# Patient Record
Sex: Male | Born: 1965 | Race: White | Hispanic: No | Marital: Married | Smoking: Never smoker
Health system: Southern US, Community
[De-identification: ages and names within clinical notes are randomized; demographics above are authoritative.]

## PROBLEM LIST (undated history)

## (undated) DIAGNOSIS — L989 Disorder of the skin and subcutaneous tissue, unspecified: Secondary | ICD-10-CM

## (undated) DIAGNOSIS — I10 Essential (primary) hypertension: Secondary | ICD-10-CM

## (undated) DIAGNOSIS — B351 Tinea unguium: Secondary | ICD-10-CM

## (undated) DIAGNOSIS — E785 Hyperlipidemia, unspecified: Secondary | ICD-10-CM

## (undated) DIAGNOSIS — E663 Overweight: Secondary | ICD-10-CM

## (undated) HISTORY — DX: Disorder of the skin and subcutaneous tissue, unspecified: L98.9

## (undated) HISTORY — DX: Tinea unguium: B35.1

## (undated) HISTORY — DX: Hyperlipidemia, unspecified: E78.5

## (undated) HISTORY — DX: Overweight: E66.3

## (undated) HISTORY — DX: Essential (primary) hypertension: I10

## (undated) HISTORY — PX: WISDOM TOOTH EXTRACTION: SHX21

---

## 2007-10-09 ENCOUNTER — Ambulatory Visit: Payer: Self-pay | Admitting: General Practice

## 2009-03-02 IMAGING — CT CT ABD-PELV W/O CM
1 of 2 series · 15 of 32 positions shown, 19 images · non-contrast
Comparison: none

REASON FOR EXAM: LLQ pain  possible stones   CALL report   [id number]
COMMENTS:

PROCEDURE:     CT  - CT ABDOMEN AND PELVIS W[DATE]  [DATE]
RESULT:     Comparison: No available comparison exam.
TECHNIQUE: CT examination of the abdomen and pelvis was performed without
contrast. Collimation is 3 mm.

[Series 2: stone · axial · 0.84mm/px · z∈[-453,+45]mm · 15 of 182 slices shown, 19 images]
[im 8/182  soft-tissue]
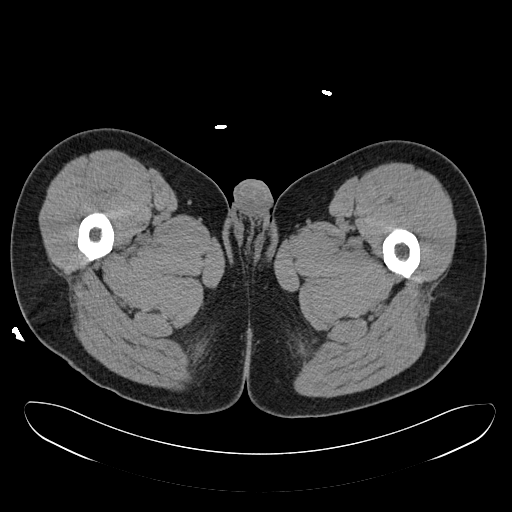
[im 8/182  bone]
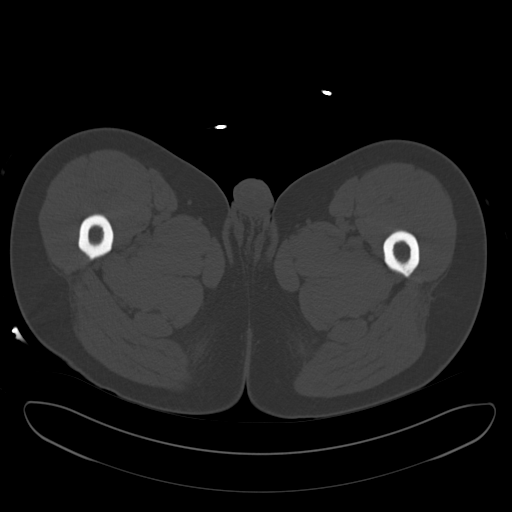
[im 22/182  soft-tissue]
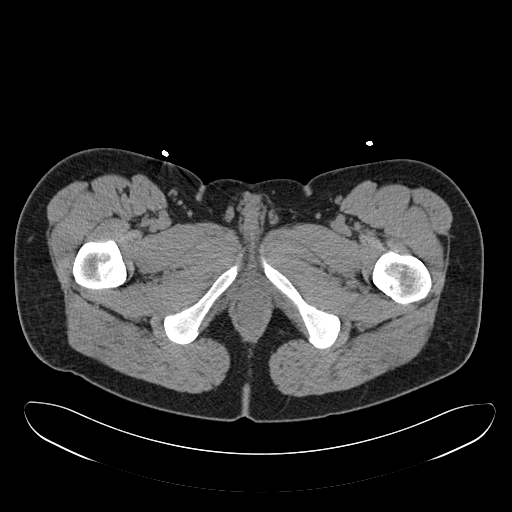
[im 37/182  soft-tissue]
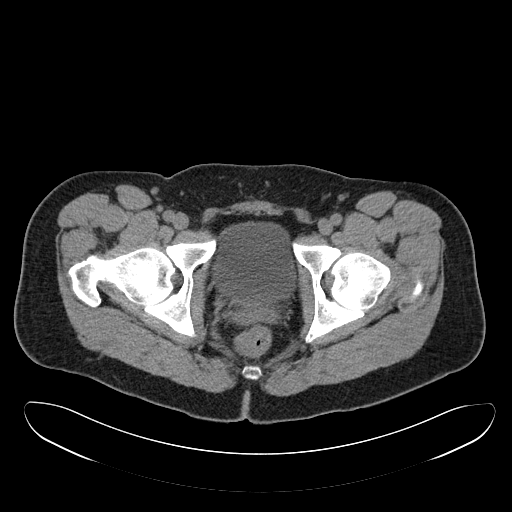
[im 51/182  soft-tissue]
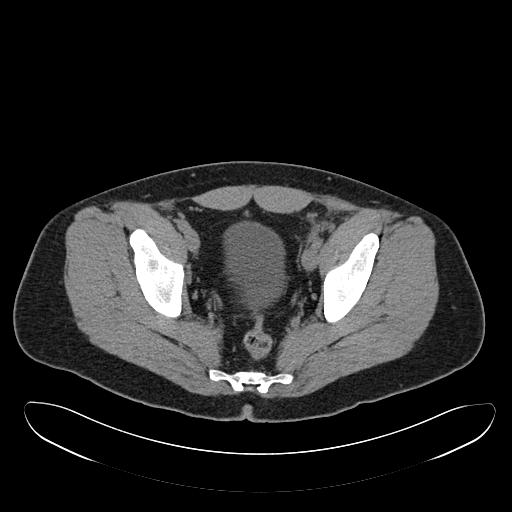
[im 66/182  soft-tissue]
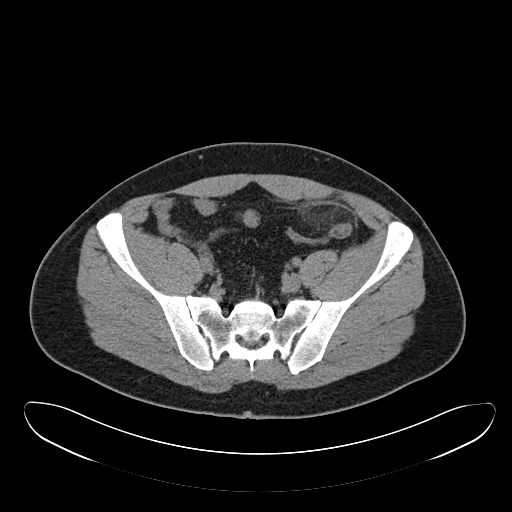
[im 80/182  soft-tissue]
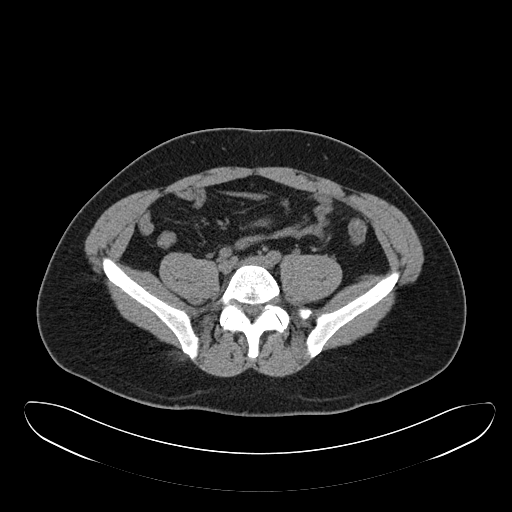
[im 95/182  soft-tissue]
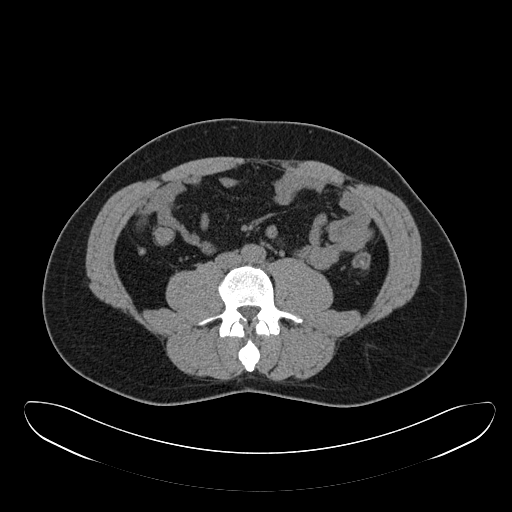
[im 102/182  soft-tissue]
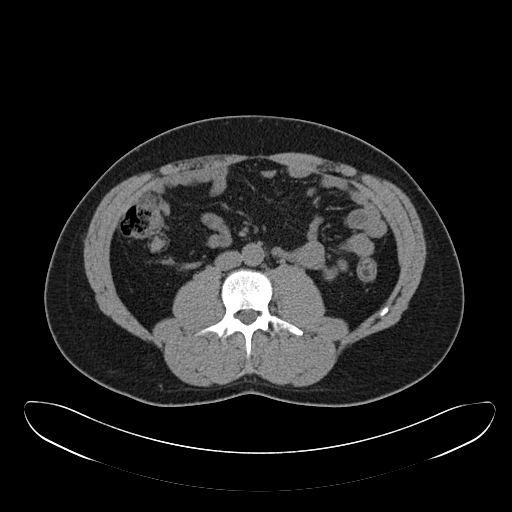
[im 116/182  soft-tissue]
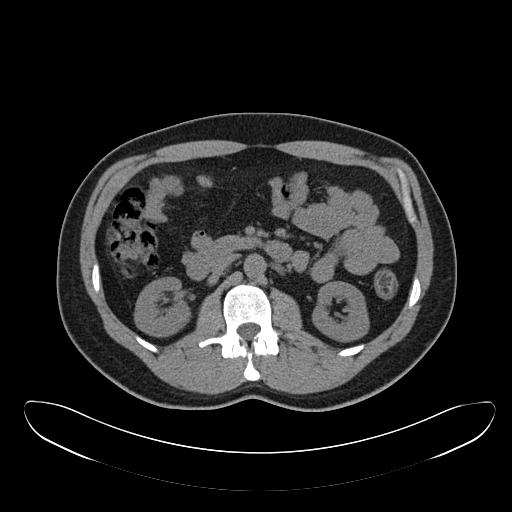
[im 116/182  bone]
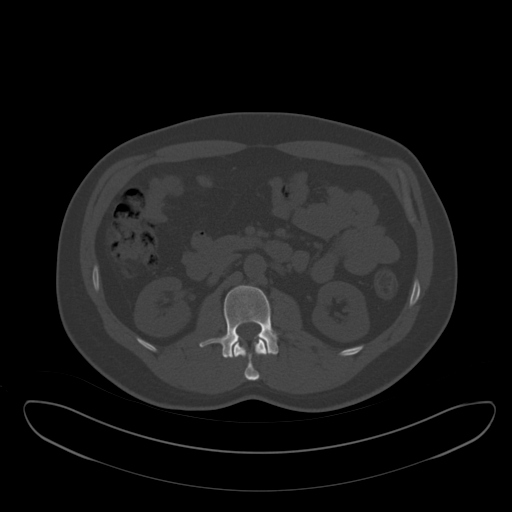
[im 131/182  soft-tissue]
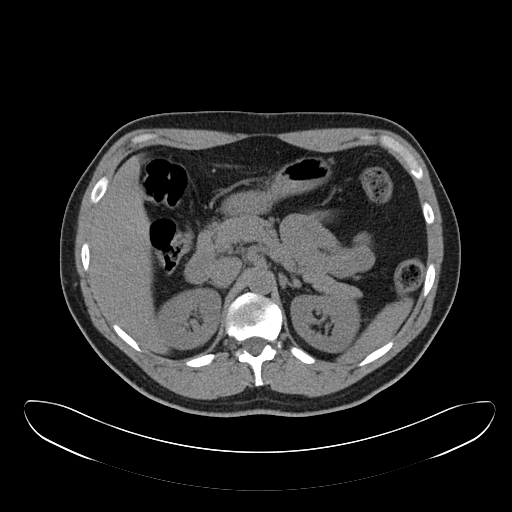
[im 145/182  soft-tissue]
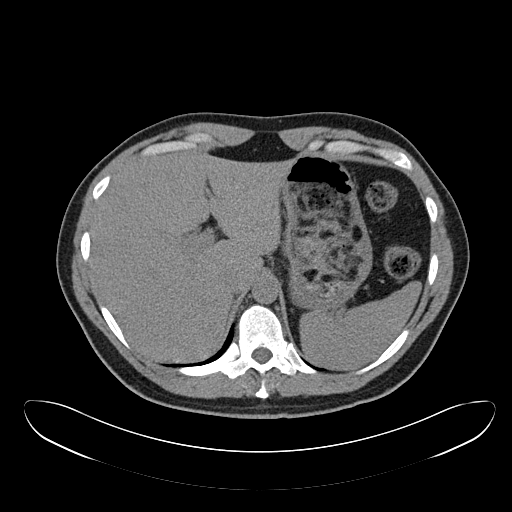
[im 153/182  lung]
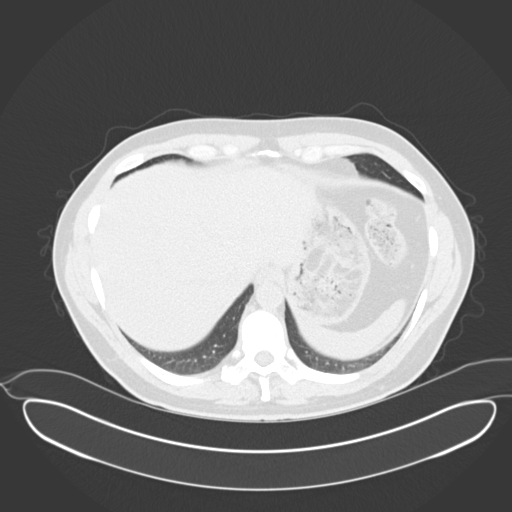
[im 160/182  soft-tissue]
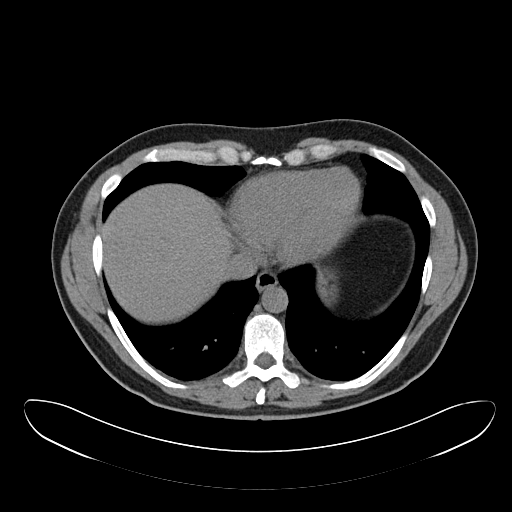
[im 160/182  lung]
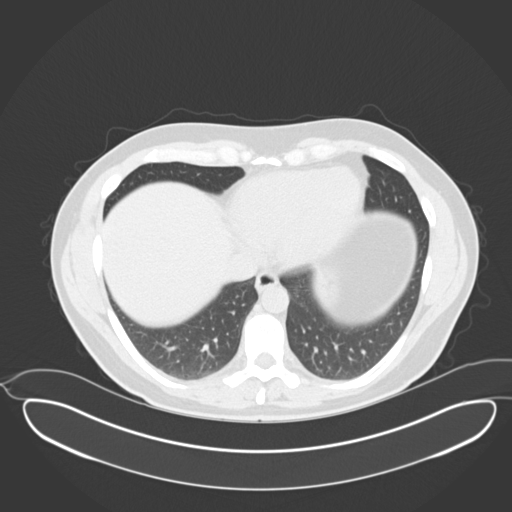
[im 167/182  lung]
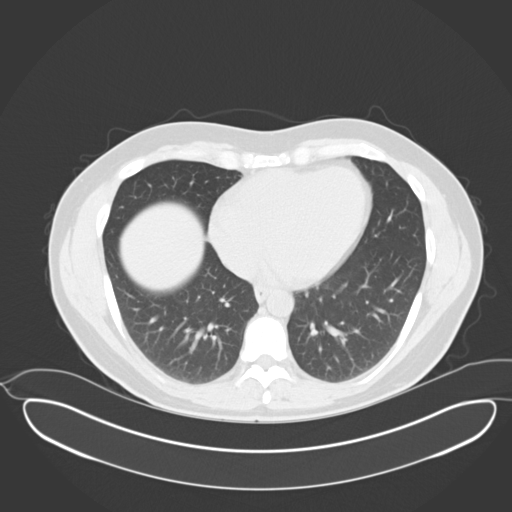
[im 174/182  soft-tissue]
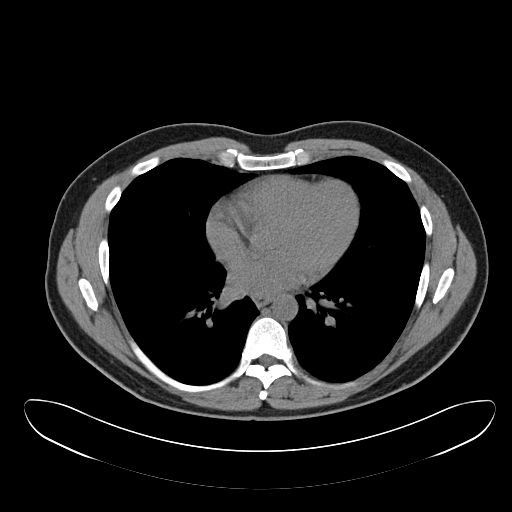
[im 174/182  lung]
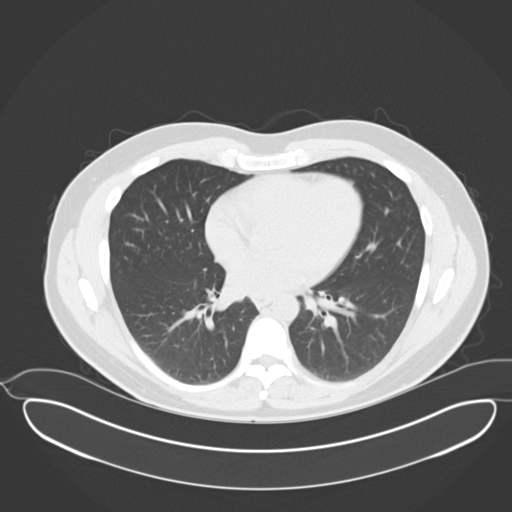

[15 of 32 positions shown; findings below may reference images not displayed]

FINDINGS: Limited evaluation of the lung bases is unremarkable.

Evaluation of the abdominal organs, bowels, and vessels is limited without
contrast. The liver, gallbladder, spleen, pancreas, and adrenal glands are
grossly unremarkable. No renal or ureteral stone is noted. There is no
hydronephrosis.

There is no dilatation of the bowels. The appendix is unremarkable. There is
focal area of intra-abdominal fat stranding involving the anterior lower
left abdominal. The adjacent sigmoid colon is decompressed and is not
grossly abnormal. There is no intraperitoneal free air. There is no
significant free fluid. There are no enlarged abdominal pelvic lymph nodes.
IMPRESSION: 1. No renal or ureteral stone.
2. Evaluation of abdominal organs, bowels, and vessels is otherwise limited
without contrast.
3. There is focal area of intra abdominal fat stranding involving the
anterior lower left abdominal. The adjacent sigmoid colon is decompressed
and is not grossly abnormal. This is nonspecific, but can be due to focal
fat necrosis.

Findings were discussed with Dr. Aranda at [DATE] on 10/09/07.

## 2016-05-19 ENCOUNTER — Other Ambulatory Visit: Payer: Self-pay | Admitting: Family Medicine

## 2016-05-20 ENCOUNTER — Other Ambulatory Visit: Payer: Self-pay | Admitting: Family Medicine

## 2016-05-20 DIAGNOSIS — R079 Chest pain, unspecified: Secondary | ICD-10-CM

## 2016-05-24 ENCOUNTER — Ambulatory Visit
Admission: RE | Admit: 2016-05-24 | Discharge: 2016-05-24 | Disposition: A | Discharge status: Home / Self Care | Payer: BLUE CROSS/BLUE SHIELD | Source: Ambulatory Visit | Attending: Family Medicine | Admitting: Family Medicine

## 2016-05-24 DIAGNOSIS — R079 Chest pain, unspecified: Secondary | ICD-10-CM

## 2016-05-24 LAB — EXERCISE TOLERANCE TEST
CSEPEDS: 0 s
CSEPPHR: 169 {beats}/min
Estimated workload: 11.7 METS
Exercise duration (min): 10 min
Rest HR: 81 {beats}/min

## 2017-08-11 HISTORY — PX: COLONOSCOPY: SHX174

## 2019-03-15 ENCOUNTER — Other Ambulatory Visit: Payer: Self-pay | Admitting: Family Medicine

## 2019-04-11 ENCOUNTER — Ambulatory Visit: Payer: Self-pay

## 2019-04-11 DIAGNOSIS — Z23 Encounter for immunization: Secondary | ICD-10-CM

## 2019-09-19 ENCOUNTER — Other Ambulatory Visit: Payer: Self-pay

## 2019-09-19 ENCOUNTER — Ambulatory Visit: Payer: 59

## 2019-09-19 DIAGNOSIS — Z0000 Encounter for general adult medical examination without abnormal findings: Secondary | ICD-10-CM

## 2019-09-19 LAB — POCT URINALYSIS DIPSTICK
Bilirubin, UA: NEGATIVE
Blood, UA: NEGATIVE
Glucose, UA: NEGATIVE
Ketones, UA: NEGATIVE
Leukocytes, UA: NEGATIVE
Nitrite, UA: NEGATIVE
Protein, UA: NEGATIVE
Spec Grav, UA: >=1.03 — AB (ref 1.010–1.025)
Urobilinogen, UA: 0.2 E.U./dL
pH, UA: 5 (ref 5.0–8.0)

## 2019-09-19 NOTE — Progress Notes (Signed)
Patient comes in today for pre physical labs and EKG. Patient is scheduled with Durward Parcel, PA-C on 09/26/2019.

## 2019-09-20 LAB — CMP12+LP+TP+TSH+6AC+PSA+CBC?
AST: 18 IU/L (ref 0–40)
Albumin/Globulin Ratio: 2.2 (ref 1.2–2.2)
BUN/Creatinine Ratio: 14 (ref 9–20)
Basos: 1 %
Bilirubin Total: 0.2 mg/dL (ref 0.0–1.2)
Calcium: 9.5 mg/dL (ref 8.7–10.2)
Chol/HDL Ratio: 4.5 ratio (ref 0.0–5.0)
Cholesterol, Total: 211 mg/dL — ABNORMAL HIGH (ref 100–199)
EOS (ABSOLUTE): 0.3 10*3/uL (ref 0.0–0.4)
Estimated CHD Risk: 0.9 times avg. (ref 0.0–1.0)
GFR calc non Af Amer: 69 mL/min/{1.73_m2} (ref >59)
Hemoglobin: 15.1 g/dL (ref 13.0–17.7)
Immature Granulocytes: 0 %
Lymphocytes Absolute: 1.3 10*3/uL (ref 0.7–3.1)
MCHC: 33.6 g/dL (ref 31.5–35.7)
MCV: 91 fL (ref 79–97)
Neutrophils Absolute: 2.6 10*3/uL (ref 1.4–7.0)
Neutrophils: 54 %
Phosphorus: 3.8 mg/dL (ref 2.8–4.1)
Platelets: 248 10*3/uL (ref 150–450)
Potassium: 5 mmol/L (ref 3.5–5.2)
TSH: 1.37 u[IU]/mL (ref 0.450–4.500)
Total Protein: 6.4 g/dL (ref 6.0–8.5)
Uric Acid: 5.7 mg/dL (ref 3.8–8.4)
VLDL Cholesterol Cal: 20 mg/dL (ref 5–40)

## 2019-09-20 LAB — CMP12+LP+TP+TSH+6AC+PSA+CBC…
ALT: 19 IU/L (ref 0–44)
Albumin: 4.4 g/dL (ref 3.8–4.9)
Alkaline Phosphatase: 69 IU/L (ref 39–117)
BUN: 17 mg/dL (ref 6–24)
Basophils Absolute: 0 10*3/uL (ref 0.0–0.2)
Chloride: 104 mmol/L (ref 96–106)
Creatinine, Ser: 1.2 mg/dL (ref 0.76–1.27)
Eos: 7 %
Free Thyroxine Index: 1.4 (ref 1.2–4.9)
GFR calc Af Amer: 79 mL/min/{1.73_m2} (ref >59)
GGT: 12 IU/L (ref 0–65)
Globulin, Total: 2 g/dL (ref 1.5–4.5)
Glucose: 107 mg/dL — ABNORMAL HIGH (ref 65–99)
HDL: 47 mg/dL (ref >39)
Hematocrit: 44.9 % (ref 37.5–51.0)
Immature Grans (Abs): 0 10*3/uL (ref 0.0–0.1)
Iron: 84 ug/dL (ref 38–169)
LDH: 117 IU/L — ABNORMAL LOW (ref 121–224)
LDL Chol Calc (NIH): 144 mg/dL — ABNORMAL HIGH (ref 0–99)
Lymphs: 27 %
MCH: 30.6 pg (ref 26.6–33.0)
Monocytes Absolute: 0.5 10*3/uL (ref 0.1–0.9)
Monocytes: 11 %
Prostate Specific Ag, Serum: 0.7 ng/mL (ref 0.0–4.0)
RBC: 4.94 x10E6/uL (ref 4.14–5.80)
RDW: 12.7 % (ref 11.6–15.4)
Sodium: 140 mmol/L (ref 134–144)
T3 Uptake Ratio: 28 % (ref 24–39)
T4, Total: 5 ug/dL (ref 4.5–12.0)
Triglycerides: 111 mg/dL (ref 0–149)
WBC: 4.8 10*3/uL (ref 3.4–10.8)

## 2019-09-26 ENCOUNTER — Encounter: Payer: Self-pay | Admitting: Physician Assistant

## 2019-09-26 ENCOUNTER — Ambulatory Visit: Payer: Self-pay | Admitting: Physician Assistant

## 2019-09-26 ENCOUNTER — Other Ambulatory Visit: Payer: Self-pay

## 2019-09-26 VITALS — BP 120/80 | HR 82 | Temp 97.9°F | Resp 12 | Ht 70.0 in | Wt 192.0 lb

## 2019-09-26 DIAGNOSIS — Z0000 Encounter for general adult medical examination without abnormal findings: Secondary | ICD-10-CM

## 2019-09-26 DIAGNOSIS — I10 Essential (primary) hypertension: Secondary | ICD-10-CM | POA: Insufficient documentation

## 2019-09-26 NOTE — Progress Notes (Signed)
   Subjective: Annual physical    Patient ID: Brett Hunter, male    DOB: 1965-08-12, 54 y.o.   MRN: [id number]  HPI Patient presents with annual physical.  No concerns or complaints. Review of Systems Skin lesions    Objective:   Physical Exam No acute distress. HEENT grossly unremarkable. Neck is supple for adenopathy or bruits. Lungs are clear to auscultation.  Heart regular rate and rhythm. Abdomen negative HSM, normoactive bowel sounds, soft nontender palpation. No obvious deformity to the upper or lower extremities.  Upper and lower extremities have full equal range of motion. No obvious cervical or lumbar spine deformity.  Patient is full equal range of motion of the cervical lumbar spine. Cranial nerves II through XII grossly intact.  .       Assessment & Plan: Well exam  Discussed lab results showing elevation of cholesterol.  Discussed lifestyle modification versus medication.  Patient elects diet and exercise for follow-up in 6 months.

## 2019-12-04 ENCOUNTER — Other Ambulatory Visit: Payer: Self-pay

## 2019-12-04 DIAGNOSIS — I10 Essential (primary) hypertension: Secondary | ICD-10-CM

## 2019-12-04 MED ORDER — LISINOPRIL 20 MG PO TABS: 20.0000 mg | ORAL_TABLET | Freq: Every day | ORAL | 2 refills | Status: DC

## 2020-05-27 ENCOUNTER — Ambulatory Visit: Payer: Self-pay

## 2020-05-27 DIAGNOSIS — Z23 Encounter for immunization: Secondary | ICD-10-CM

## 2020-06-25 ENCOUNTER — Other Ambulatory Visit: Payer: Self-pay

## 2020-06-25 DIAGNOSIS — Z1152 Encounter for screening for COVID-19: Secondary | ICD-10-CM

## 2020-06-25 NOTE — Progress Notes (Signed)
Presents to clinic requesting to see PA - no provider in the clinic today.  States daughter had strep throat recently.  C/O sore throat & congestion.  States I get a cold every year like this.  Explained to him protocol for covid screening.  Advised we will have the provider in the clinic tomorrow will contact him for a virtual visit.  AMD

## 2020-06-26 ENCOUNTER — Encounter: Payer: Self-pay | Admitting: Physician Assistant

## 2020-06-26 ENCOUNTER — Ambulatory Visit: Payer: Self-pay | Admitting: Physician Assistant

## 2020-06-26 VITALS — BP 131/88 | HR 86 | Temp 98.6°F | Resp 14 | Ht 70.0 in | Wt 193.0 lb

## 2020-06-26 DIAGNOSIS — J020 Streptococcal pharyngitis: Secondary | ICD-10-CM

## 2020-06-26 LAB — POCT RAPID STREP A (OFFICE): Rapid Strep A Screen: POSITIVE — AB

## 2020-06-26 LAB — NOVEL CORONAVIRUS, NAA: SARS-CoV-2, NAA: NOT DETECTED

## 2020-06-26 LAB — SARS-COV-2, NAA 2 DAY TAT

## 2020-06-26 MED ORDER — LIDOCAINE VISCOUS HCL 2 % MT SOLN: 5.0000 mL | Freq: Four times a day (QID) | OROMUCOSAL | 0 refills | Status: DC | PRN

## 2020-06-26 MED ORDER — AMOXICILLIN 875 MG PO TABS: 875.0000 mg | ORAL_TABLET | Freq: Two times a day (BID) | ORAL | 0 refills | Status: DC

## 2020-06-26 MED ORDER — PSEUDOEPH-BROMPHEN-DM 30-2-10 MG/5ML PO SYRP: 5.0000 mL | ORAL_SOLUTION | Freq: Four times a day (QID) | ORAL | 0 refills | Status: DC | PRN

## 2020-06-26 NOTE — Progress Notes (Signed)
° °  Subjective: Sore throat    Patient ID: Brett Hunter, male    DOB: Jul 03, 1966, 54 y.o.   MRN: [id number]  HPI Patient complain of sore throat for 1 week.  Patient exposed to strep by his grand daughter last week.  Patient with mild discomfort with swallowing.  Able to tolerate food and fluids.  Denies recent contact with known contact with COVID-19.  Patient also complain of intermitting productive/nonproductive cough for the same period of time.  Patient has taken the vaccine for COVID-19 also taken the flu shot.   Review of Systems Hypertension    Objective:   Physical Exam No acute distress.  HEENT small for erythematous findings well exudative tonsils.  Bilateral cervical lymphadenopathy.  Positive rapid strep test.  Lungs are clear to auscultation.  Heart regular rate and rhythm.       Assessment & Plan: Strep pharyngitis  Patient given discharge care instructions.  Patient advised take medication as directed consistent amoxicillin, viscous lidocaine, and Bromfed-DM.  Follow-up if no improvement 3 to 5 days.

## 2020-06-26 NOTE — Progress Notes (Signed)
Pt presents today with sore throat, glads are sore and body aches. Pt states he's a little congested. Symptoms started last Wednesday. CL,RMA

## 2020-07-28 DIAGNOSIS — Z01818 Encounter for other preprocedural examination: Secondary | ICD-10-CM

## 2020-08-13 ENCOUNTER — Other Ambulatory Visit: Payer: Self-pay

## 2020-08-13 ENCOUNTER — Ambulatory Visit: Payer: Self-pay

## 2020-08-13 DIAGNOSIS — Z0000 Encounter for general adult medical examination without abnormal findings: Secondary | ICD-10-CM

## 2020-08-13 LAB — POCT URINALYSIS DIPSTICK
Bilirubin, UA: NEGATIVE
Blood, UA: NEGATIVE
Glucose, UA: NEGATIVE
Ketones, UA: NEGATIVE
Leukocytes, UA: NEGATIVE
Nitrite, UA: NEGATIVE
Protein, UA: NEGATIVE
Spec Grav, UA: >=1.03 — AB (ref 1.010–1.025)
Urobilinogen, UA: 0.2 E.U./dL
pH, UA: 6 (ref 5.0–8.0)

## 2020-08-13 NOTE — Progress Notes (Signed)
Scheduled to complete physical 08/20/2020 with Dr. William Holder.  AMD 

## 2020-08-14 LAB — CMP12+LP+TP+TSH+6AC+PSA+CBC…
ALT: 19 IU/L (ref 0–44)
AST: 19 IU/L (ref 0–40)
Albumin/Globulin Ratio: 2.1 (ref 1.2–2.2)
Albumin: 4.4 g/dL (ref 3.8–4.9)
Alkaline Phosphatase: 64 IU/L (ref 44–121)
BUN/Creatinine Ratio: 12 (ref 9–20)
BUN: 15 mg/dL (ref 6–24)
Basophils Absolute: 0 10*3/uL (ref 0.0–0.2)
Basos: 1 %
Bilirubin Total: 0.3 mg/dL (ref 0.0–1.2)
Calcium: 9.6 mg/dL (ref 8.7–10.2)
Chloride: 103 mmol/L (ref 96–106)
Chol/HDL Ratio: 4.6 ratio (ref 0.0–5.0)
Cholesterol, Total: 208 mg/dL — ABNORMAL HIGH (ref 100–199)
Creatinine, Ser: 1.24 mg/dL (ref 0.76–1.27)
EOS (ABSOLUTE): 0.2 10*3/uL (ref 0.0–0.4)
Eos: 4 %
Estimated CHD Risk: 0.9 times avg. (ref 0.0–1.0)
Free Thyroxine Index: 1.6 (ref 1.2–4.9)
GFR calc Af Amer: 76 mL/min/{1.73_m2} (ref >59)
GFR calc non Af Amer: 65 mL/min/{1.73_m2} (ref >59)
GGT: 10 IU/L (ref 0–65)
Globulin, Total: 2.1 g/dL (ref 1.5–4.5)
Glucose: 102 mg/dL — ABNORMAL HIGH (ref 65–99)
HDL: 45 mg/dL (ref >39)
Hematocrit: 44.7 % (ref 37.5–51.0)
Hemoglobin: 15.4 g/dL (ref 13.0–17.7)
Immature Grans (Abs): 0 10*3/uL (ref 0.0–0.1)
Immature Granulocytes: 0 %
Iron: 74 ug/dL (ref 38–169)
LDH: 124 IU/L (ref 121–224)
LDL Chol Calc (NIH): 148 mg/dL — ABNORMAL HIGH (ref 0–99)
Lymphocytes Absolute: 1.5 10*3/uL (ref 0.7–3.1)
Lymphs: 31 %
MCH: 30.9 pg (ref 26.6–33.0)
MCHC: 34.5 g/dL (ref 31.5–35.7)
MCV: 90 fL (ref 79–97)
Monocytes Absolute: 0.5 10*3/uL (ref 0.1–0.9)
Monocytes: 11 %
Neutrophils Absolute: 2.6 10*3/uL (ref 1.4–7.0)
Neutrophils: 53 %
Phosphorus: 3.1 mg/dL (ref 2.8–4.1)
Platelets: 259 10*3/uL (ref 150–450)
Potassium: 5 mmol/L (ref 3.5–5.2)
Prostate Specific Ag, Serum: 0.6 ng/mL (ref 0.0–4.0)
RBC: 4.98 x10E6/uL (ref 4.14–5.80)
RDW: 12.4 % (ref 11.6–15.4)
Sodium: 139 mmol/L (ref 134–144)
T3 Uptake Ratio: 27 % (ref 24–39)
T4, Total: 5.9 ug/dL (ref 4.5–12.0)
TSH: 1.16 u[IU]/mL (ref 0.450–4.500)
Total Protein: 6.5 g/dL (ref 6.0–8.5)
Triglycerides: 85 mg/dL (ref 0–149)
Uric Acid: 5.9 mg/dL (ref 3.8–8.4)
VLDL Cholesterol Cal: 15 mg/dL (ref 5–40)
WBC: 4.9 10*3/uL (ref 3.4–10.8)

## 2020-08-20 ENCOUNTER — Ambulatory Visit: Payer: Self-pay | Admitting: Adult Medicine

## 2020-08-20 ENCOUNTER — Encounter: Payer: Self-pay | Admitting: Adult Medicine

## 2020-08-20 ENCOUNTER — Other Ambulatory Visit: Payer: Self-pay

## 2020-08-20 VITALS — BP 110/77 | HR 80 | Temp 98.6°F | Resp 12 | Ht 70.0 in | Wt 183.0 lb

## 2020-08-20 DIAGNOSIS — I10 Essential (primary) hypertension: Secondary | ICD-10-CM

## 2020-08-20 DIAGNOSIS — Z0000 Encounter for general adult medical examination without abnormal findings: Secondary | ICD-10-CM

## 2020-08-20 MED ORDER — METOLAZONE 5 MG PO TABS: 5.0000 mg | ORAL_TABLET | Freq: Every day | ORAL | 2 refills | Status: DC

## 2020-08-20 MED ORDER — LISINOPRIL 20 MG PO TABS: 20.0000 mg | ORAL_TABLET | Freq: Every day | ORAL | 2 refills | Status: DC

## 2020-08-20 MED ORDER — POTASSIUM CHLORIDE CRYS ER 20 MEQ PO TBCR
20.0000 meq | EXTENDED_RELEASE_TABLET | Freq: Every evening | ORAL | 3 refills | Status: DC
Start: 2020-08-20 — End: 2023-07-27

## 2020-08-20 NOTE — Progress Notes (Signed)
HISTORY  Chief Complaint No chief complaint on file.   HPI Brett Hunter is a 55 y.o. male    Annual Exam denies health concern or complaint Meds report he only takes Zestril 10mg  since last physical Past Medical History:  Diagnosis Date  . Elevated lipids   . Hypertension   . Overweight   . Skin lesion   . Tinea unguium   . Toenail fungus    Patient Active Problem List   Diagnosis Date Noted  . Hypertension 09/26/2019   Past Surgical History:  Procedure Laterality Date  . COLONOSCOPY  08/2017  . WISDOM TOOTH EXTRACTION     Prior to Admission medications   Medication Sig Start Date End Date Taking? Authorizing Provider  amoxicillin (AMOXIL) 875 MG tablet Take 1 tablet (875 mg total) by mouth 2 (two) times daily. 06/26/20   06/28/20, PA-C  brompheniramine-pseudoephedrine-DM 30-2-10 MG/5ML syrup Take 5 mLs by mouth 4 (four) times daily as needed. Mix with 5 mL of viscous lidocaine for swish and swallow 06/26/20   06/28/20, PA-C  Cyanocobalamin (VITAMIN B-12 PO) Take 1 tablet by mouth daily.    [provider]  lidocaine (XYLOCAINE) 2 % solution Use as directed 5 mLs in the mouth or throat every 6 (six) hours as needed for mouth pain. Mix with 5 mL of Bromfed-DM for swish and swallow 06/26/20   06/28/20, PA-C  lisinopril (ZESTRIL) 20 MG tablet Take 1 tablet (20 mg total) by mouth daily. 12/04/19   12/06/19, MD  Multiple Vitamin (MULTIVITAMIN ADULT PO) Take 1 tablet by mouth daily.    [provider]  Omega-3 Fatty Acids (FISH OIL PO) Take 1 capsule by mouth daily.    [provider]    Allergies Patient has no known allergies.  Family History  Problem Relation Age of Onset  . Diabetes Father     Social History Social History   Tobacco Use  . Smoking status: Never Smoker  . Smokeless tobacco: Current User    Types: Snuff    Review of Systems Constitutional: No fever/chills Eyes: No visual changes. ENT: No  sore throat. Cardiovascular: Denies chest pain. Respiratory: Denies shortness of breath. Gastrointestinal: No abdominal pain.  No nausea, no vomiting.  No diarrhea.  No constipation. Genitourinary: Negative for dysuria. Musculoskeletal: Negative for back pain. Skin: Negative for rash. Neurological: Negative for headaches, focal weakness or numbness. Psychiatric: stable mood ___________________________________  PHYSICAL EXAM:  VITAL SIGNS: 110/70 O2 sat98 Constitutional: Alert and oriented. Well appearing and in no acute distress. Eyes: Conjunctivae are normal. PERRL. EOMI. Red facies Head: Atraumatic. Nose: No congestion/rhinnorhea. Mouth/Throat: Mucous membranes are moist.  Oropharynx non-erythematous. Neck: No stridor. No cervical spine tenderness to palpation Hematological/Lymphatic/Immunilogical: No cervical lymphadenopathy. Cardiovascular: Normal rate, regular rhythm. Grossly normal heart sounds.  Good peripheral circulation. Respiratory: Normal respiratory effort.  No retractions. Lungs CTAB. Gastrointestinal: Soft and nontender. No distention. No abdominal bruits. No CVA tenderness. Genitourinary: 2 nl size scrotal testes, prostrate3x3" no nodularity nontender, guiac neg Musculoskeletal: No lower extremity tenderness nor edema.  No joint effusions. Neurologic:  Normal speech and language. No gross focal neurologic deficits are appreciated. No gait instability. Skin:  Skin is warm, dry and intact. Lt forearm  Rt calf 39m excematoid rash noted. No visible nevi Psychiatric: Mood and affect are normal. Speech and behavior are normal. _________________________________   LABS Glucose 102High mg/dL Chol/HDL Ratio 4.6 ratio   Uric Acid 5.9 mg/dL  Estimated CHD Risk  0.9 times avg.   BUN 15 mg/dL TSH 4.765 uIU/mL  Creatinine, Ser 1.24 mg/dL T4, Total 5.9 ug/dL  GFR calc non Af Amer 65 mL/min/1.73 T3 Uptake Ratio 27 %  GFR calc Af Amer 76 mL/min/1.73  Free Thyroxine Index 1.6   BUN/Creatinine Ratio 12 Prostate Specific Ag, Serum 0.6 ng/mL   Sodium 139 mmol/L WBC 4.9 x10E3/uL  Potassium 5.0 mmol/L RBC 4.98 x10E6/uL  Chloride 103 mmol/L Hemoglobin 15.4 g/dL  Calcium 9.6 mg/dL Hematocrit 46.5 %  Phosphorus 3.1 mg/dL MCV 90 fL  Total Protein 6.5 g/dL MCH 03.5 pg  Albumin 4.4 g/dL MCHC 46.5 g/dL  Globulin, Total 2.1 g/dL RDW 68.1 %  Albumin/Globulin Ratio 2.1 Platelets 259 x10E3/uL  Bilirubin Total 0.3 mg/dL Neutrophils 53 %  Alkaline Phosphatase 64 IU/L Lymphs 31 %  LDH 124 IU/L Monocytes 11 %  AST 19 IU/L Eos 4 %  ALT 19 IU/L Basos 1 %  GGT 10 IU/L Neutrophils Absolute 2.6 x10E3/uL  Iron 74 ug/dL Lymphocytes Absolute 1.5 x10E3/uL  Cholesterol, Total 208High mg/dL Monocytes Absolute 0.5 x10E3/uL  Triglycerides 85 mg/dL EOS (ABSOLUTE) 0.2 E75T7/GY  HDL 45 mg/dL Basophils Absolute 0.0 x10E3/uL  VLDL Cholesterol Cal 15 mg/dL Immature Granulocytes 0 %  LDL Chol Calc (NIH) 148High mg/dL     Spec Grav, UA 1.749 - 1.025 >=1.030Abnormal    ___________________________________  EKG   nsr nl axis intervals morphology [poss early lae] without ectopy     INITIAL IMPRESSION / ASSESSMENT  Well Annual exam, bp well controlled on 1omg Zestril. Client would like to titrate from all meds  Transition to zaroxylyn 5->2.5mg  am, KCL16meq pm. D/c ACEi.  He is reactivating exercise regimen.  rtn 2wk for inoffice bp check

## 2021-07-16 ENCOUNTER — Ambulatory Visit: Payer: Self-pay

## 2021-07-16 ENCOUNTER — Other Ambulatory Visit: Payer: Self-pay

## 2021-07-16 DIAGNOSIS — Z0110 Encounter for examination of ears and hearing without abnormal findings: Secondary | ICD-10-CM

## 2021-07-16 DIAGNOSIS — Z0000 Encounter for general adult medical examination without abnormal findings: Secondary | ICD-10-CM

## 2021-07-16 LAB — POCT URINALYSIS DIPSTICK
Bilirubin, UA: NEGATIVE
Blood, UA: NEGATIVE
Glucose, UA: NEGATIVE
Ketones, UA: NEGATIVE
Leukocytes, UA: NEGATIVE
Nitrite, UA: NEGATIVE
Protein, UA: NEGATIVE
Spec Grav, UA: >=1.03 — AB (ref 1.010–1.025)
Urobilinogen, UA: NEGATIVE E.U./dL — AB
pH, UA: 6 (ref 5.0–8.0)

## 2021-07-16 NOTE — Progress Notes (Signed)
Pt presents today for annual physical labs.

## 2021-07-16 NOTE — Progress Notes (Signed)
Presents for Baseline Hearing Screen.  Works for [employer] the department is part of the [employer] program.  Brett Hunter states he had a hearing test years ago, but he's not in the Workplace system, so had to put today's test as baseline.  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  Moved to [facility] & now has some duties that he's using equipment that he previously didn't use & now needs to have an annual hearing screen.  AMD

## 2021-07-17 LAB — CMP12+LP+TP+TSH+6AC+PSA+CBC…
ALT: 14 IU/L (ref 0–44)
AST: 14 IU/L (ref 0–40)
Albumin/Globulin Ratio: 2 (ref 1.2–2.2)
Albumin: 4.5 g/dL (ref 3.8–4.9)
Alkaline Phosphatase: 80 IU/L (ref 44–121)
BUN/Creatinine Ratio: 18 (ref 9–20)
BUN: 21 mg/dL (ref 6–24)
Basophils Absolute: 0 10*3/uL (ref 0.0–0.2)
Basos: 1 %
Bilirubin Total: 0.5 mg/dL (ref 0.0–1.2)
Calcium: 9.5 mg/dL (ref 8.7–10.2)
Chloride: 103 mmol/L (ref 96–106)
Chol/HDL Ratio: 5.1 ratio — ABNORMAL HIGH (ref 0.0–5.0)
Cholesterol, Total: 213 mg/dL — ABNORMAL HIGH (ref 100–199)
Creatinine, Ser: 1.2 mg/dL (ref 0.76–1.27)
EOS (ABSOLUTE): 0.2 10*3/uL (ref 0.0–0.4)
Eos: 4 %
Estimated CHD Risk: 1 times avg. (ref 0.0–1.0)
Free Thyroxine Index: 1.4 (ref 1.2–4.9)
GGT: 12 IU/L (ref 0–65)
Globulin, Total: 2.2 g/dL (ref 1.5–4.5)
Glucose: 93 mg/dL (ref 70–99)
HDL: 42 mg/dL (ref >39)
Hematocrit: 48.3 % (ref 37.5–51.0)
Hemoglobin: 16.1 g/dL (ref 13.0–17.7)
Immature Grans (Abs): 0 10*3/uL (ref 0.0–0.1)
Immature Granulocytes: 0 %
Iron: 115 ug/dL (ref 38–169)
LDH: 140 IU/L (ref 121–224)
LDL Chol Calc (NIH): 155 mg/dL — ABNORMAL HIGH (ref 0–99)
Lymphocytes Absolute: 1.4 10*3/uL (ref 0.7–3.1)
Lymphs: 28 %
MCH: 30.5 pg (ref 26.6–33.0)
MCHC: 33.3 g/dL (ref 31.5–35.7)
MCV: 92 fL (ref 79–97)
Monocytes Absolute: 0.5 10*3/uL (ref 0.1–0.9)
Monocytes: 11 %
Neutrophils Absolute: 2.9 10*3/uL (ref 1.4–7.0)
Neutrophils: 56 %
Phosphorus: 3.5 mg/dL (ref 2.8–4.1)
Platelets: 254 10*3/uL (ref 150–450)
Potassium: 4.5 mmol/L (ref 3.5–5.2)
Prostate Specific Ag, Serum: 0.5 ng/mL (ref 0.0–4.0)
RBC: 5.28 x10E6/uL (ref 4.14–5.80)
RDW: 12.3 % (ref 11.6–15.4)
Sodium: 140 mmol/L (ref 134–144)
T3 Uptake Ratio: 28 % (ref 24–39)
T4, Total: 5.1 ug/dL (ref 4.5–12.0)
TSH: 0.918 u[IU]/mL (ref 0.450–4.500)
Total Protein: 6.7 g/dL (ref 6.0–8.5)
Triglycerides: 88 mg/dL (ref 0–149)
Uric Acid: 6 mg/dL (ref 3.8–8.4)
VLDL Cholesterol Cal: 16 mg/dL (ref 5–40)
WBC: 5.1 10*3/uL (ref 3.4–10.8)
eGFR: 72 mL/min/{1.73_m2} (ref >59)

## 2021-07-22 ENCOUNTER — Encounter: Payer: Self-pay | Admitting: Physician Assistant

## 2021-07-22 ENCOUNTER — Other Ambulatory Visit: Payer: Self-pay

## 2021-07-22 ENCOUNTER — Ambulatory Visit: Payer: Self-pay | Admitting: Physician Assistant

## 2021-07-22 VITALS — BP 125/90 | HR 93 | Temp 96.2°F | Resp 14 | Ht 70.0 in | Wt 181.0 lb

## 2021-07-22 DIAGNOSIS — Z0000 Encounter for general adult medical examination without abnormal findings: Secondary | ICD-10-CM

## 2021-07-22 NOTE — Addendum Note (Signed)
Addended by: Devin Ganaway F on: 07/22/2021 03:55 PM ° ° Modules accepted: Orders ° °

## 2021-07-22 NOTE — Progress Notes (Signed)
Pt presents today to complete physical.  

## 2021-07-22 NOTE — Progress Notes (Signed)
[place]  ____________________________________________   None    (approximate)  I have reviewed the triage vital signs and the nursing notes.   HISTORY  Chief Complaint Annual Exam    HPI Brett Hunter is a 56 y.o. male patient presents for annual physical exam.  Patient questioning complaints or concerns.         Past Medical History:  Diagnosis Date   Elevated lipids    Hypertension    Overweight    Skin lesion    Tinea unguium    Toenail fungus     Patient Active Problem List   Diagnosis Date Noted   Hypertension 09/26/2019    Past Surgical History:  Procedure Laterality Date   COLONOSCOPY  08/2017   WISDOM TOOTH EXTRACTION      Prior to Admission medications   Medication Sig Start Date End Date Taking? Authorizing Provider  Cyanocobalamin (VITAMIN B-12 PO) Take 1 tablet by mouth daily.    [provider]  lidocaine (XYLOCAINE) 2 % solution Use as directed 5 mLs in the mouth or throat every 6 (six) hours as needed for mouth pain. Mix with 5 mL of Bromfed-DM for swish and swallow 06/26/20   Sable Feil, PA-C  metolazone (ZAROXOLYN) 5 MG tablet Take 1 tablet (5 mg total) by mouth daily. 08/20/20 09/19/20  Cecil Cobbs, MD  Multiple Vitamin (MULTIVITAMIN ADULT PO) Take 1 tablet by mouth daily.    [provider]  Omega-3 Fatty Acids (FISH OIL PO) Take 1 capsule by mouth daily.    [provider]  potassium chloride SA (KLOR-CON) 20 MEQ tablet Take 1 tablet (20 mEq total) by mouth at bedtime. 08/20/20   Cecil Cobbs, MD    Allergies Patient has no known allergies.  Family History  Problem Relation Age of Onset   Diabetes Father     Social History Social History   Tobacco Use   Smoking status: Never   Smokeless tobacco: Current    Types: Snuff    Review of Systems Constitutional: No fever/chills Eyes: No visual changes. ENT: No sore throat. Cardiovascular: Denies chest pain. Respiratory: Denies  shortness of breath. Gastrointestinal: No abdominal pain.  No nausea, no vomiting.  No diarrhea.  No constipation. Genitourinary: Negative for dysuria. Musculoskeletal: Negative for back pain. Skin: Negative for rash. Neurological: Negative for headaches, focal weakness or numbness. Endocrine: Hyperlipidemia and hypertension ____________________________________________   PHYSICAL EXAM:  VITAL SIGNS: Temperature is 97.6, pulse 93, BP is 125/90, patient 90% O2 sat on room air.  Patient was 183 pounds. Constitutional: Alert and oriented. Well appearing and in no acute distress. Eyes: Conjunctivae are normal. PERRL. EOMI. Head: Atraumatic. Nose: No congestion/rhinnorhea. Mouth/Throat: Mucous membranes are moist.  Oropharynx non-erythematous. Neck: No stridor.  No cervical spine tenderness to palpation. Hematological/Lymphatic/Immunilogical: No cervical lymphadenopathy. }Cardiovascular: Normal rate, regular rhythm. Grossly normal heart sounds.  Good peripheral circulation. Respiratory: Normal respiratory effort.  No retractions. Lungs CTAB. Gastrointestinal: Soft and nontender. No distention. No abdominal bruits. No CVA tenderness. Genitourinary: Deferred Musculoskeletal: No lower extremity tenderness nor edema.  No joint effusions. Neurologic:  Normal speech and language. No gross focal neurologic deficits are appreciated. No gait instability. Skin:  Skin is warm, dry and intact. No rash noted. Psychiatric: Mood and affect are normal. Speech and behavior are normal.  ____________________________________________   LABS Component Ref Range & Units 6 d ago 11 mo ago 1 yr ago  Glucose 70 - 99 mg/dL 93  102 High  R  107 High  R   Uric Acid 3.8 - 8.4 mg/dL 6.0  5.9 CM  5.7 CM   Comment:            Therapeutic target for gout patients: <6.0  BUN 6 - 24 mg/dL '21  15  17   ' Creatinine, Ser 0.76 - 1.27 mg/dL 1.20  1.24  1.20   eGFR >59 mL/min/1.73 72     BUN/Creatinine Ratio 9 - '20 18  12  14    ' Sodium 134 - 144 mmol/L 140  139  140   Potassium 3.5 - 5.2 mmol/L 4.5  5.0  5.0   Chloride 96 - 106 mmol/L 103  103  104   Calcium 8.7 - 10.2 mg/dL 9.5  9.6  9.5   Phosphorus 2.8 - 4.1 mg/dL 3.5  3.1  3.8   Total Protein 6.0 - 8.5 g/dL 6.7  6.5  6.4   Albumin 3.8 - 4.9 g/dL 4.5  4.4  4.4   Globulin, Total 1.5 - 4.5 g/dL 2.2  2.1  2.0   Albumin/Globulin Ratio 1.2 - 2.2 2.0  2.1  2.2   Bilirubin Total 0.0 - 1.2 mg/dL 0.5  0.3  0.2   Alkaline Phosphatase 44 - 121 IU/L 80  64  69 R   LDH 121 - 224 IU/L 140  124  117 Low    AST 0 - 40 IU/L '14  19  18   ' ALT 0 - 44 IU/L '14  19  19   ' GGT 0 - 65 IU/L '12  10  12   ' Iron 38 - 169 ug/dL 115  74  84   Cholesterol, Total 100 - 199 mg/dL 213 High   208 High   211 High    Triglycerides 0 - 149 mg/dL 88  85  111   HDL >39 mg/dL 42  45  47   VLDL Cholesterol Cal 5 - 40 mg/dL '16  15  20   ' LDL Chol Calc (NIH) 0 - 99 mg/dL 155 High   148 High   144 High    Chol/HDL Ratio 0.0 - 5.0 ratio 5.1 High   4.6 CM  4.5 CM   Comment:                                   T. Chol/HDL Ratio                                              Men  Women                                1/2 Avg.Risk  3.4    3.3                                    Avg.Risk  5.0    4.4                                 2X Avg.Risk  9.6    7.1  3X Avg.Risk 23.4   11.0   Estimated CHD Risk 0.0 - 1.0 times avg. 1.0  0.9 CM  0.9 CM   Comment: The CHD Risk is based on the T. Chol/HDL ratio. Other  factors affect CHD Risk such as hypertension, smoking,  diabetes, severe obesity, and family history of  premature CHD.   TSH 0.450 - 4.500 uIU/mL 0.918  1.160  1.370   T4, Total 4.5 - 12.0 ug/dL 5.1  5.9  5.0   T3 Uptake Ratio 24 - 39 % '28  27  28   ' Free Thyroxine Index 1.2 - 4.9 1.4  1.6  1.4   Prostate Specific Ag, Serum 0.0 - 4.0 ng/mL 0.5  0.6 CM  0.7 CM   Comment: Roche ECLIA methodology.  According to the American Urological Association, Serum PSA should  decrease and  remain at undetectable levels after radical  prostatectomy. The AUA defines biochemical recurrence as an initial  PSA value 0.2 ng/mL or greater followed by a subsequent confirmatory  PSA value 0.2 ng/mL or greater.  Values obtained with different assay methods or kits cannot be used  interchangeably. Results cannot be interpreted as absolute evidence  of the presence or absence of malignant disease.   WBC 3.4 - 10.8 x10E3/uL 5.1  4.9  4.8   RBC 4.14 - 5.80 x10E6/uL 5.28  4.98  4.94   Hemoglobin 13.0 - 17.7 g/dL 16.1  15.4  15.1   Hematocrit 37.5 - 51.0 % 48.3  44.7  44.9   MCV 79 - 97 fL 92  90  91   MCH 26.6 - 33.0 pg 30.5  30.9  30.6   MCHC 31.5 - 35.7 g/dL 33.3  34.5  33.6   RDW 11.6 - 15.4 % 12.3  12.4  12.7   Platelets 150 - 450 x10E3/uL 254  259  248   Neutrophils Not Estab. % 56  53  54   Lymphs Not Estab. % '28  31  27   ' Monocytes Not Estab. % '11  11  11   ' Eos Not Estab. % '4  4  7   ' Basos Not Estab. % '1  1  1   ' Neutrophils Absolute 1.4 - 7.0 x10E3/uL 2.9  2.6  2.6   Lymphocytes Absolute 0.7 - 3.1 x10E3/uL 1.4  1.5  1.3   Monocytes Absolute 0.1 - 0.9 x10E3/uL 0.5  0.5  0.5   EOS (ABSOLUTE) 0.0 - 0.4 x10E3/uL 0.2  0.2  0.3   Basophils Absolute 0.0 - 0.2 x10E3/uL 0.0  0.0  0.0   Immature Granulocytes Not Estab. % 0  0  0   Immature Grans (Abs) 0.0 - 0.1 x10E3/uL 0.0  0.0  0.0   Resulting Agency  [facility]       Narrative Performed by: Maryan Puls Performed at:  [facility]  [address], [place], [place]  [id number]  Lab Director: Rush Farmer MD, Phone:  [phone]    Specimen Collected: 07/16/21 08:36 Last Resulted: 07/17/21 08:12      Lab Flowsheet    Order Details    View Encounter    Lab and Collection Details    Routing    Result History    View Encounter Conversation      CM=Additional comments  R=Reference range differs from displayed range      Result Care Coordination   Patient Communication   Add Comments   Add  Notifications  Back to Top  Other Results from 07/16/2021   Contains abnormal data POCT urinalysis dipstick Order: [id number] Status: Final result    Visible to patient: Yes (not seen)    Next appt: None    Dx: Routine adult health maintenance    0 Result Notes Component Ref Range & Units 6 d ago 11 mo ago 1 yr ago  Color, UA  yellow  Yellow  yellow   Clarity, UA  clear  Clear  clear   Glucose, UA Negative Negative  Negative  Negative   Bilirubin, UA  negative  Negative  negative   Ketones, UA  negative  Negative  negative   Spec Grav, UA 1.010 - 1.025 >=1.030 Abnormal   >=1.030 Abnormal   >=1.030 Abnormal    Blood, UA  negative  Negative  negative   pH, UA 5.0 - 8.0 6.0  6.0  5.0   Protein, UA Negative Negative  Negative  Negative   Urobilinogen, UA 0.2 or 1.0 E.U./dL negative Abnormal   0.2  0.2   Nitrite, UA  negative  Negative  negative   Leukocytes, UA Negative Negative  Negative  Negative   Appearance  dark                  ____________________________________________  EKG  Sinus  Rhythm  WITHIN NORMAL LIMITS ____________________________________________  ____________________________________________    ____________________________________________   INITIAL IMPRESSION / ASSESSMENT AND PLAN   As part of my medical decision making, I reviewed the following data within the electronic MEDICAL RECORD NUMBER  Well exam.  Discussed lab results             ____________________________________________     ED Discharge Orders     None        Note:  This document was prepared using Dragon voice recognition software and may include unintentional dictation errors.

## 2022-02-10 ENCOUNTER — Other Ambulatory Visit: Payer: Self-pay

## 2022-02-10 DIAGNOSIS — I10 Essential (primary) hypertension: Secondary | ICD-10-CM

## 2022-02-11 MED ORDER — METOLAZONE 5 MG PO TABS: 5.0000 mg | ORAL_TABLET | Freq: Every day | ORAL | 0 refills | Status: DC

## 2022-03-15 ENCOUNTER — Ambulatory Visit: Payer: 59 | Admitting: Physician Assistant

## 2022-03-15 ENCOUNTER — Encounter: Payer: Self-pay | Admitting: Physician Assistant

## 2022-03-15 VITALS — BP 147/93 | HR 70 | Temp 98.0°F | Resp 16

## 2022-03-15 DIAGNOSIS — J029 Acute pharyngitis, unspecified: Secondary | ICD-10-CM

## 2022-03-15 LAB — POCT RAPID STREP A (OFFICE): Rapid Strep A Screen: NEGATIVE

## 2022-03-15 MED ORDER — LIDOCAINE VISCOUS HCL 2 % MT SOLN: 15.0000 mL | Freq: Four times a day (QID) | OROMUCOSAL | 0 refills | Status: DC | PRN

## 2022-03-15 MED ORDER — PSEUDOEPH-BROMPHEN-DM 30-2-10 MG/5ML PO SYRP: 5.0000 mL | ORAL_SOLUTION | Freq: Four times a day (QID) | ORAL | 0 refills | Status: DC | PRN

## 2022-03-15 NOTE — Progress Notes (Signed)
Sore throat resolved - glands feel tender & something about throat doesn't feel right.  Used Alkaseltzer products when he had S/Sx from Covid 2 weeks ago.  Denies congestion, ear discomfort, facial pain/pressure  Not taking any medications - no antihistamines  AMD

## 2022-03-15 NOTE — Progress Notes (Signed)
   Subjective: Sore throat    Patient ID: Brett Hunter, male    DOB: [birth year], 56 y.o.   MRN: [id number]  HPI Patient complain of sore throat for last 2 to 3 days.  Patient was diagnosed with COVID-19 2 weeks ago and only use over-the-counter medication for relief.  Patient denies dysphagia.  Denies fever/chills.  Denies sinus congestion but does admit to postnasal drainage.   Review of Systems Hypertension    Objective:   Physical Exam BP is 147/93, pulse 70, respirations 16, temperature 98, patient 90% O2 sat on room air.  HEENT is unremarkable. Strep test was negative      Assessment & Plan: Viral pharyngitis  Discussed negative strep results with patient.  Patient amenable to a trial of viscous lidocaine and Bromfed-DM.  Patient to follow-up in 5 days if no improvement or worsening complaints

## 2022-05-12 ENCOUNTER — Other Ambulatory Visit: Payer: Self-pay | Admitting: Physician Assistant

## 2022-05-12 DIAGNOSIS — I10 Essential (primary) hypertension: Secondary | ICD-10-CM

## 2022-07-22 ENCOUNTER — Ambulatory Visit: Payer: Self-pay

## 2022-07-22 DIAGNOSIS — Z0000 Encounter for general adult medical examination without abnormal findings: Secondary | ICD-10-CM

## 2022-07-22 LAB — POCT URINALYSIS DIPSTICK
Bilirubin, UA: NEGATIVE
Blood, UA: NEGATIVE
Glucose, UA: NEGATIVE
Ketones, UA: NEGATIVE
Leukocytes, UA: NEGATIVE
Nitrite, UA: NEGATIVE
Protein, UA: NEGATIVE
Spec Grav, UA: 1.025 (ref 1.010–1.025)
Urobilinogen, UA: 0.2 E.U./dL
pH, UA: 7.5 (ref 5.0–8.0)

## 2022-07-22 NOTE — Progress Notes (Signed)
PT presents today to complete lab portion of physical. Pt scheduled to return and complete.

## 2022-07-23 LAB — CMP12+LP+TP+TSH+6AC+PSA+CBC…
ALT: 21 IU/L (ref 0–44)
AST: 19 IU/L (ref 0–40)
Albumin/Globulin Ratio: 1.8 (ref 1.2–2.2)
Albumin: 4.7 g/dL (ref 3.8–4.9)
Alkaline Phosphatase: 96 IU/L (ref 44–121)
BUN/Creatinine Ratio: 21 — ABNORMAL HIGH (ref 9–20)
BUN: 27 mg/dL — ABNORMAL HIGH (ref 6–24)
Basophils Absolute: 0 10*3/uL (ref 0.0–0.2)
Basos: 1 %
Bilirubin Total: 0.5 mg/dL (ref 0.0–1.2)
Calcium: 10.2 mg/dL (ref 8.7–10.2)
Chloride: 92 mmol/L — ABNORMAL LOW (ref 96–106)
Chol/HDL Ratio: 5.1 ratio — ABNORMAL HIGH (ref 0.0–5.0)
Cholesterol, Total: 240 mg/dL — ABNORMAL HIGH (ref 100–199)
Creatinine, Ser: 1.28 mg/dL — ABNORMAL HIGH (ref 0.76–1.27)
EOS (ABSOLUTE): 0.2 10*3/uL (ref 0.0–0.4)
Eos: 4 %
Estimated CHD Risk: 1.1 times avg. — ABNORMAL HIGH (ref 0.0–1.0)
Free Thyroxine Index: 2.3 (ref 1.2–4.9)
GGT: 18 IU/L (ref 0–65)
Globulin, Total: 2.6 g/dL (ref 1.5–4.5)
Glucose: 103 mg/dL — ABNORMAL HIGH (ref 70–99)
HDL: 47 mg/dL (ref >39)
Hematocrit: 48.3 % (ref 37.5–51.0)
Hemoglobin: 16.6 g/dL (ref 13.0–17.7)
Immature Grans (Abs): 0 10*3/uL (ref 0.0–0.1)
Immature Granulocytes: 0 %
Iron: 141 ug/dL (ref 38–169)
LDH: 125 IU/L (ref 121–224)
LDL Chol Calc (NIH): 165 mg/dL — ABNORMAL HIGH (ref 0–99)
Lymphocytes Absolute: 1.9 10*3/uL (ref 0.7–3.1)
Lymphs: 32 %
MCH: 30.6 pg (ref 26.6–33.0)
MCHC: 34.4 g/dL (ref 31.5–35.7)
MCV: 89 fL (ref 79–97)
Monocytes Absolute: 0.5 10*3/uL (ref 0.1–0.9)
Monocytes: 9 %
Neutrophils Absolute: 3.3 10*3/uL (ref 1.4–7.0)
Neutrophils: 54 %
Phosphorus: 3.2 mg/dL (ref 2.8–4.1)
Platelets: 275 10*3/uL (ref 150–450)
Potassium: 3.5 mmol/L (ref 3.5–5.2)
Prostate Specific Ag, Serum: 0.6 ng/mL (ref 0.0–4.0)
RBC: 5.42 x10E6/uL (ref 4.14–5.80)
RDW: 12.1 % (ref 11.6–15.4)
Sodium: 135 mmol/L (ref 134–144)
T3 Uptake Ratio: 30 % (ref 24–39)
T4, Total: 7.5 ug/dL (ref 4.5–12.0)
TSH: 1.55 u[IU]/mL (ref 0.450–4.500)
Total Protein: 7.3 g/dL (ref 6.0–8.5)
Triglycerides: 151 mg/dL — ABNORMAL HIGH (ref 0–149)
Uric Acid: 6.5 mg/dL (ref 3.8–8.4)
VLDL Cholesterol Cal: 28 mg/dL (ref 5–40)
WBC: 6 10*3/uL (ref 3.4–10.8)
eGFR: 66 mL/min/{1.73_m2} (ref >59)

## 2022-07-26 ENCOUNTER — Encounter: Payer: Self-pay | Admitting: Physician Assistant

## 2022-07-26 ENCOUNTER — Ambulatory Visit: Payer: Self-pay | Admitting: Physician Assistant

## 2022-07-26 VITALS — BP 124/79 | HR 66 | Temp 97.7°F | Resp 12 | Ht 70.0 in | Wt 175.0 lb

## 2022-07-26 DIAGNOSIS — E782 Mixed hyperlipidemia: Secondary | ICD-10-CM | POA: Insufficient documentation

## 2022-07-26 DIAGNOSIS — Z0000 Encounter for general adult medical examination without abnormal findings: Secondary | ICD-10-CM

## 2022-07-26 DIAGNOSIS — I10 Essential (primary) hypertension: Secondary | ICD-10-CM

## 2022-07-26 MED ORDER — METOLAZONE 5 MG PO TABS: 5.0000 mg | ORAL_TABLET | Freq: Every day | ORAL | 3 refills | Status: DC

## 2022-07-26 MED ORDER — ROSUVASTATIN CALCIUM 40 MG PO TABS: 40.0000 mg | ORAL_TABLET | Freq: Every day | ORAL | 3 refills | Status: DC

## 2022-07-26 NOTE — Progress Notes (Signed)
Pt presents today to complete physical, Pt needs rx refill on BP medication metolazone 5mg .

## 2022-07-26 NOTE — Progress Notes (Signed)
[facility] ____________________________________________   None    (approximate)  I have reviewed the triage vital signs and the nursing notes.   HISTORY  Chief Complaint Annual Exam   HPI Brett Hunter is a 57 y.o. male patient here for annual physical exam.  Patient voices no concerns or complaints.         Past Medical History:  Diagnosis Date   Elevated lipids    Hypertension    Overweight    Skin lesion    Tinea unguium    Toenail fungus     Patient Active Problem List   Diagnosis Date Noted   Hypertension 09/26/2019    Past Surgical History:  Procedure Laterality Date   COLONOSCOPY  08/2017   WISDOM TOOTH EXTRACTION      Prior to Admission medications   Medication Sig Start Date End Date Taking? Authorizing Provider  Cyanocobalamin (VITAMIN B-12 PO) Take 1 tablet by mouth daily.   Yes [provider]  hydrocortisone 2.5 % cream Apply topically. 05/12/21  Yes [provider]  metolazone (ZAROXOLYN) 5 MG tablet Take 1 tablet (5 mg total) by mouth daily. 07/26/22  Yes Sable Feil, PA-C  Multiple Vitamin (MULTIVITAMIN ADULT PO) Take 1 tablet by mouth daily.   Yes [provider]  Omega-3 Fatty Acids (FISH OIL PO) Take 1 capsule by mouth daily.   Yes [provider]  potassium chloride SA (KLOR-CON) 20 MEQ tablet Take 1 tablet (20 mEq total) by mouth at bedtime. 08/20/20  Yes Cecil Cobbs, MD  rosuvastatin (CRESTOR) 40 MG tablet Take 1 tablet (40 mg total) by mouth daily. 07/26/22  Yes Sable Feil, PA-C  metolazone (ZAROXOLYN) 5 MG tablet Take 1 tablet (5 mg total) by mouth daily. 02/11/22 05/12/22  Sable Feil, PA-C    Allergies Patient has no known allergies.  Family History  Problem Relation Age of Onset   Diabetes Father     Social History Social History   Tobacco Use   Smoking status: Never   Smokeless tobacco: Current    Types: Snuff    Review of  Systems Constitutional: No fever/chills Eyes: No visual changes. ENT: No sore throat. Cardiovascular: Denies chest pain. Respiratory: Denies shortness of breath. Gastrointestinal: No abdominal pain.  No nausea, no vomiting.  No diarrhea.  No constipation. Genitourinary: Negative for dysuria. Musculoskeletal: Negative for back pain. Skin: Negative for rash. Neurological: Negative for headaches, focal weakness or numbness. Endocrine: Hyperlipidemia and hypertension   ____________________________________________   PHYSICAL EXAM:  VITAL SIGNS: BP is 124/79, pulse 66, respiration 12, temperature 97.7, patient 90% O2 sat on room air.  Patient was 175 pounds and BMI is 25.11. Constitutional: Alert and oriented. Well appearing and in no acute distress. Eyes: Conjunctivae are normal. PERRL. EOMI. Head: Atraumatic. Nose: No congestion/rhinnorhea. Mouth/Throat: Mucous membranes are moist.  Oropharynx non-erythematous. Neck: No stridor.  No cervical spine tenderness to palpation. Hematological/Lymphatic/Immunilogical: No cervical lymphadenopathy. Cardiovascular: Normal rate, regular rhythm. Grossly normal heart sounds.  Good peripheral circulation. Respiratory: Normal respiratory effort.  No retractions. Lungs CTAB. Gastrointestinal: Soft and nontender. No distention. No abdominal bruits. No CVA tenderness. Genitourinary: Deferred Musculoskeletal: No lower extremity tenderness nor edema.  No joint effusions. Neurologic:  Normal speech and language. No gross focal neurologic deficits are appreciated. No gait instability. Skin:  Skin is warm, dry and intact. No rash noted. Psychiatric: Mood and affect are normal. Speech and behavior are normal.  ____________________________________________   Brett Hunter  Component Ref Range & Units 4 d ago (07/22/22) 1 yr ago (07/16/21) 1 yr ago (08/13/20) 2 yr ago (09/19/19)  Color, UA  yellow yellow Yellow yellow  Clarity, UA  clear clear Clear clear   Glucose, UA Negative Negative Negative Negative Negative  Bilirubin, UA  neg negative Negative negative  Ketones, UA  neg negative Negative negative  Spec Grav, UA 1.010 - 1.025 1.025 >=1.030 Abnormal  >=1.030 Abnormal  >=1.030 Abnormal   Blood, UA  neg negative Negative negative  pH, UA 5.0 - 8.0 7.5 6.0 6.0 5.0  Protein, UA Negative Negative Negative Negative Negative  Urobilinogen, UA 0.2 or 1.0 E.U./dL 0.2 negative Abnormal  0.2 0.2  Nitrite, UA  neg negative Negative negative  Leukocytes, UA Negative Negative Negative Negative Negative  Appearance   dark    Odor              Specimen Collected: 07/22/22 08:50 Last Resulted: 07/22/22 08:50      Lab Flowsheet      Order Details      View Encounter      Lab and Collection Details      Routing      Result History    View All Conversations on this Encounter        Result Care Coordination   Patient Communication   Add Comments   Seen Back to Top      Other Results from 07/22/2022   Contains abnormal data CMP12+LP+TP+TSH+6AC+PSA+CBC. Order: [id number] Status: Final result      Visible to patient: Yes (seen)      Next appt: 10/25/2022 at 08:15 AM in [facility] ([facility])      Dx: Routine adult health maintenance    0 Result Notes           Component Ref Range & Units 4 d ago (07/22/22) 1 yr ago (07/16/21) 1 yr ago (08/13/20) 2 yr ago (09/19/19)  Glucose 70 - 99 mg/dL 932 High  93 355 High  R 107 High  R  Uric Acid 3.8 - 8.4 mg/dL 6.5 6.0 CM 5.9 CM 5.7 CM  Comment:            Therapeutic target for gout patients: <6.0  BUN 6 - 24 mg/dL 27 High  21 15 17   Creatinine, Ser 0.76 - 1.27 mg/dL High  7.32 2.02 5.42  eGFR >59 mL/min/1.73 66 72    BUN/Creatinine Ratio 9 - 20 21 High  18 12 14   Sodium 134 - 144 mmol/L 135 140 139 140  Potassium 3.5 - 5.2 mmol/L 3.5 4.5 5.0 5.0  Chloride 96 - 106 mmol/L 92 Low  103 103 104  Calcium 8.7 - 10.2 mg/dL 7.06 9.5 9.6 9.5  Phosphorus 2.8 - 4.1 mg/dL 3.2 3.5  3.1 3.8  Total Protein 6.0 - 8.5 g/dL 7.3 6.7 6.5 6.4  Albumin 3.8 - 4.9 g/dL 4.7 4.5 4.4 4.4  Globulin, Total 1.5 - 4.5 g/dL 2.6 2.2 2.1 2.0  Albumin/Globulin Ratio 1.2 - 2.2 1.8 2.0 2.1 2.2  Bilirubin Total 0.0 - 1.2 mg/dL 0.5 0.5 0.3 0.2  Alkaline Phosphatase 44 - 121 IU/L 96 80 64 69 R  LDH 121 - 224 IU/L 125 140 124 117 Low   AST 0 - 40 IU/L 19 14 19 18   ALT 0 - 44 IU/L 21 14 19 19   GGT 0 - 65 IU/L 18 12 10 12   Iron 38 - 169 ug/dL 23.7 74 84  Cholesterol, Total 100 - 199 mg/dL 240 High  213 High  208 High  211 High   Triglycerides 0 - 149 mg/dL 151 High  88 85 111  HDL >39 mg/dL 47 42 45 47  VLDL Cholesterol Cal 5 - 40 mg/dL 28 16 15 20   LDL Chol Calc (NIH) 0 - 99 mg/dL 165 High  155 High  148 High  144 High   Chol/HDL Ratio 0.0 - 5.0 ratio 5.1 High  5.1 High  CM 4.6 CM 4.5 CM  Comment:                                   T. Chol/HDL Ratio                                             Men  Women                               1/2 Avg.Risk  3.4    3.3                                   Avg.Risk  5.0    4.4                                2X Avg.Risk  9.6    7.1                                3X Avg.Risk 23.4   11.0  Estimated CHD Risk 0.0 - 1.0 times avg. 1.1 High  1.0 CM 0.9 CM 0.9 CM  Comment: The CHD Risk is based on the T. Chol/HDL ratio. Other factors affect CHD Risk such as hypertension, smoking, diabetes, severe obesity, and family history of premature CHD.  TSH 0.450 - 4.500 uIU/mL 1.550 0.918 1.160 1.370  T4, Total 4.5 - 12.0 ug/dL 7.5 5.1 5.9 5.0  T3 Uptake Ratio 24 - 39 % 30 28 27 28   Free Thyroxine Index 1.2 - 4.9 2.3 1.4 1.6 1.4  Prostate Specific Ag, Serum 0.0 - 4.0 ng/mL 0.6 0.5 CM 0.6 CM 0.7 CM  Comment: Roche ECLIA methodology. According to the American Urological Association, Serum PSA should decrease and remain at undetectable levels after radical prostatectomy. The AUA defines biochemical recurrence as an initial PSA value 0.2 ng/mL or greater followed by a  subsequent confirmatory PSA value 0.2 ng/mL or greater. Values obtained with different assay methods or kits cannot be used interchangeably. Results cannot be interpreted as absolute evidence of the presence or absence of malignant disease.  WBC 3.4 - 10.8 x10E3/uL 6.0 5.1 4.9 4.8  RBC 4.14 - 5.80 x10E6/uL 5.42 5.28 4.98 4.94  Hemoglobin 13.0 - 17.7 g/dL 16.6 16.1 15.4 15.1  Hematocrit 37.5 - 51.0 % 48.3 48.3 44.7 44.9  MCV 79 - 97 fL 89 92 90 91  MCH 26.6 - 33.0 pg 30.6 30.5 30.9 30.6  MCHC 31.5 - 35.7 g/dL 34.4 33.3 34.5 33.6  RDW 11.6 - 15.4 % 12.1 12.3 12.4 12.7  Platelets 150 - 450 x10E3/uL  275 254 259 248  Neutrophils Not Estab. % 54 56 53 54  Lymphs Not Estab. % 32 28 31 27   Monocytes Not Estab. % 9 11 11 11   Eos Not Estab. % 4 4 4 7   Basos Not Estab. % 1 1 1 1   Neutrophils Absolute 1.4 - 7.0 x10E3/uL 3.3 2.9 2.6 2.6  Lymphocytes Absolute 0.7 - 3.1 x10E3/uL 1.9 1.4 1.5 1.3  Monocytes Absolute 0.1 - 0.9 x10E3/uL 0.5 0.5 0.5 0.5  EOS (ABSOLUTE) 0.0 - 0.4 x10E3/uL 0.2 0.2 0.2 0.3  Basophils Absolute 0.0 - 0.2 x10E3/uL 0.0 0.0 0.0 0.0  Immature Granulocytes Not Estab. % 0 0 0 0               ____________________________________________  EKG   Normal sinus rhythm at 69 bpm ____________________________________________   ____________________________________________   INITIAL IMPRESSION / ASSESSMENT AND PLAN  As part of my medical decision making, I reviewed the following data within the electronic MEDICAL RECORD NUMBER         Discussed lab results with patient.  Patient is amenable to starting a statin due to mixed hyperlipidemia.  Patient will follow-up in 3 months fasting lipid profile.     ____________________________________________   FINAL CLINICAL IMPRESSION Well exam with findings of hyperlipidemia.   ED Discharge Orders          Ordered    rosuvastatin (CRESTOR) 40 MG tablet  Daily        07/26/22 0913    metolazone (ZAROXOLYN) 5 MG tablet  Daily         07/26/22 0913             Note:  This document was prepared using Dragon voice recognition software and may include unintentional dictation errors.

## 2022-10-25 ENCOUNTER — Other Ambulatory Visit: Payer: Self-pay

## 2022-10-25 DIAGNOSIS — E782 Mixed hyperlipidemia: Secondary | ICD-10-CM

## 2022-10-26 LAB — LIPID PANEL
Chol/HDL Ratio: 3 ratio (ref 0.0–5.0)
Cholesterol, Total: 149 mg/dL (ref 100–199)
HDL: 49 mg/dL (ref >39)
LDL Chol Calc (NIH): 80 mg/dL (ref 0–99)
Triglycerides: 108 mg/dL (ref 0–149)
VLDL Cholesterol Cal: 20 mg/dL (ref 5–40)

## 2023-05-12 ENCOUNTER — Ambulatory Visit: Payer: Self-pay

## 2023-05-12 DIAGNOSIS — Z23 Encounter for immunization: Secondary | ICD-10-CM

## 2023-07-13 ENCOUNTER — Ambulatory Visit: Payer: Self-pay | Admitting: Physician Assistant

## 2023-07-13 ENCOUNTER — Encounter: Payer: Self-pay | Admitting: Physician Assistant

## 2023-07-13 DIAGNOSIS — J020 Streptococcal pharyngitis: Secondary | ICD-10-CM

## 2023-07-13 LAB — POC COVID19 BINAXNOW: SARS Coronavirus 2 Ag: NEGATIVE

## 2023-07-13 LAB — POCT RAPID STREP A (OFFICE): Rapid Strep A Screen: POSITIVE — AB

## 2023-07-13 MED ORDER — AMOXICILLIN 875 MG PO TABS: 875.0000 mg | ORAL_TABLET | Freq: Two times a day (BID) | ORAL | 0 refills | Status: AC

## 2023-07-13 NOTE — Progress Notes (Signed)
   Subjective: Sore throat    Patient ID: Brett Hunter, male    DOB: 1965/12/23, 58 y.o.   MRN: [id number]  HPI Patient complain of sore throat x 3 days.  States postnasal drainage.  Denies fever chills associated complaint.  Tolerating food and fluids.   Review of Systems Hypertension    Objective:   Physical Exam See nurses note for vital signs. HEENT is remarkable postnasal drainage and erythematous pharynx. Neck is supple for lymphadenopathy or bruits. Lungs clear to auscultation. Heart is regular rate and rhythm. Rapid strep test is positive.       Assessment & Plan:   Patient given a prescription for amoxicillin  and advised to continue over-the-counter decongestant antihistamines.

## 2023-07-13 NOTE — Progress Notes (Signed)
 Pt presents today with sore throat and drainage since Sunday. OTC medication is helping with the drainage not the sore throat.  Pt Positive for strep throat.

## 2023-07-14 ENCOUNTER — Other Ambulatory Visit: Payer: Self-pay | Admitting: Physician Assistant

## 2023-07-14 DIAGNOSIS — E782 Mixed hyperlipidemia: Secondary | ICD-10-CM

## 2023-07-20 ENCOUNTER — Ambulatory Visit: Payer: Self-pay

## 2023-07-20 DIAGNOSIS — Z0000 Encounter for general adult medical examination without abnormal findings: Secondary | ICD-10-CM

## 2023-07-20 LAB — POCT URINALYSIS DIPSTICK
Bilirubin, UA: NEGATIVE
Blood, UA: NEGATIVE
Glucose, UA: NEGATIVE
Ketones, UA: NEGATIVE
Leukocytes, UA: NEGATIVE
Nitrite, UA: NEGATIVE
Protein, UA: NEGATIVE
Spec Grav, UA: 1.02 (ref 1.010–1.025)
Urobilinogen, UA: 0.2 U/dL
pH, UA: 6 (ref 5.0–8.0)

## 2023-07-20 NOTE — Progress Notes (Signed)
 Pt completed labs for physical. Brett Hunter

## 2023-07-21 LAB — CMP12+LP+TP+TSH+6AC+PSA+CBC…
ALT: 29 [IU]/L (ref 0–44)
AST: 30 [IU]/L (ref 0–40)
Albumin: 4.4 g/dL (ref 3.8–4.9)
Alkaline Phosphatase: 90 [IU]/L (ref 44–121)
BUN/Creatinine Ratio: 11 (ref 9–20)
BUN: 14 mg/dL (ref 6–24)
Basophils Absolute: 0 10*3/uL (ref 0.0–0.2)
Basos: 1 %
Bilirubin Total: 0.6 mg/dL (ref 0.0–1.2)
Calcium: 9.6 mg/dL (ref 8.7–10.2)
Chloride: 95 mmol/L — ABNORMAL LOW (ref 96–106)
Chol/HDL Ratio: 2.6 {ratio} (ref 0.0–5.0)
Cholesterol, Total: 121 mg/dL (ref 100–199)
Creatinine, Ser: 1.29 mg/dL — ABNORMAL HIGH (ref 0.76–1.27)
EOS (ABSOLUTE): 0.3 10*3/uL (ref 0.0–0.4)
Eos: 5 %
Estimated CHD Risk: <0.5 times avg. (ref 0.0–1.0)
Free Thyroxine Index: 2.4 (ref 1.2–4.9)
GGT: 13 [IU]/L (ref 0–65)
Globulin, Total: 2.5 g/dL (ref 1.5–4.5)
Glucose: 102 mg/dL — ABNORMAL HIGH (ref 70–99)
HDL: 46 mg/dL (ref >39)
Hematocrit: 46.3 % (ref 37.5–51.0)
Hemoglobin: 15.9 g/dL (ref 13.0–17.7)
Immature Grans (Abs): 0.2 10*3/uL — ABNORMAL HIGH (ref 0.0–0.1)
Immature Granulocytes: 4 %
Iron: 136 ug/dL (ref 38–169)
LDH: 157 [IU]/L (ref 121–224)
LDL Chol Calc (NIH): 59 mg/dL (ref 0–99)
Lymphocytes Absolute: 1.5 10*3/uL (ref 0.7–3.1)
Lymphs: 27 %
MCH: 31.8 pg (ref 26.6–33.0)
MCHC: 34.3 g/dL (ref 31.5–35.7)
MCV: 93 fL (ref 79–97)
Monocytes Absolute: 0.6 10*3/uL (ref 0.1–0.9)
Monocytes: 11 %
Neutrophils Absolute: 2.9 10*3/uL (ref 1.4–7.0)
Neutrophils: 52 %
Phosphorus: 2.9 mg/dL (ref 2.8–4.1)
Platelets: 256 10*3/uL (ref 150–450)
Potassium: 3.4 mmol/L — ABNORMAL LOW (ref 3.5–5.2)
Prostate Specific Ag, Serum: 0.9 ng/mL (ref 0.0–4.0)
RBC: 5 x10E6/uL (ref 4.14–5.80)
RDW: 12 % (ref 11.6–15.4)
Sodium: 140 mmol/L (ref 134–144)
T3 Uptake Ratio: 31 % (ref 24–39)
T4, Total: 7.7 ug/dL (ref 4.5–12.0)
TSH: 0.938 u[IU]/mL (ref 0.450–4.500)
Total Protein: 6.9 g/dL (ref 6.0–8.5)
Triglycerides: 84 mg/dL (ref 0–149)
Uric Acid: 6.3 mg/dL (ref 3.8–8.4)
VLDL Cholesterol Cal: 16 mg/dL (ref 5–40)
WBC: 5.5 10*3/uL (ref 3.4–10.8)
eGFR: 65 mL/min/{1.73_m2} (ref >59)

## 2023-07-27 ENCOUNTER — Encounter: Payer: Self-pay | Admitting: Physician Assistant

## 2023-07-27 ENCOUNTER — Ambulatory Visit: Payer: Self-pay | Admitting: Physician Assistant

## 2023-07-27 VITALS — BP 111/71 | HR 72 | Temp 96.9°F | Resp 16 | Ht 70.0 in | Wt 174.0 lb

## 2023-07-27 DIAGNOSIS — Z0000 Encounter for general adult medical examination without abnormal findings: Secondary | ICD-10-CM

## 2023-07-27 NOTE — Progress Notes (Signed)
Here for yearly physical and voices no complaints.  Works [employer]-oversees the [employer].

## 2023-07-27 NOTE — Progress Notes (Signed)
[facility] ____________________________________________   None    (approximate)  I have reviewed the triage vital signs and the nursing notes.   HISTORY  Chief Complaint Annual Exam   HPI Brett Hunter is a 58 y.o. male patient presents for annual physical exam.  Patient with no acute concerns or complaints.        { Past Medical History:  Diagnosis Date   Elevated lipids    Hypertension    Overweight    Skin lesion    Tinea unguium    Toenail fungus     Patient Active Problem List   Diagnosis Date Noted   Hypertension 09/26/2019    Past Surgical History:  Procedure Laterality Date   COLONOSCOPY  08/2017   WISDOM TOOTH EXTRACTION      Prior to Admission medications   Medication Sig Start Date End Date Taking? Authorizing Provider  metolazone (ZAROXOLYN) 5 MG tablet Take 1 tablet (5 mg total) by mouth daily. 07/26/22  Yes Joni Reining, PA-C  Multiple Vitamin (MULTIVITAMIN ADULT PO) Take 1 tablet by mouth daily.   Yes [provider]  rosuvastatin (CRESTOR) 40 MG tablet TAKE 1 TABLET BY MOUTH EVERY DAY 07/14/23  Yes Joni Reining, PA-C  Cyanocobalamin (VITAMIN B-12 PO) Take 1 tablet by mouth daily. Patient not taking: Reported on 07/27/2023    [provider]  hydrocortisone 2.5 % cream Apply topically. Patient not taking: Reported on 07/27/2023 05/12/21   [provider]  Omega-3 Fatty Acids (FISH OIL PO) Take 1 capsule by mouth daily. Patient not taking: Reported on 07/27/2023    [provider]    Allergies Patient has no known allergies.  Family History  Problem Relation Age of Onset   Diabetes Father     Social History Social History   Tobacco Use   Smoking status: Never   Smokeless tobacco: Current    Types: Snuff    Review of Systems Constitutional: No fever/chills Eyes: No visual changes. ENT: No sore throat. Cardiovascular: Denies chest pain. Respiratory: Denies  shortness of breath. Gastrointestinal: No abdominal pain.  No nausea, no vomiting.  No diarrhea.  No constipation. Genitourinary: Negative for dysuria. Musculoskeletal: Negative for back pain. Skin: Negative for rash. Neurological: Negative for headaches, focal weakness or numbness. Endocrine: Hyperlipidemia   ____________________________________________   PHYSICAL EXAM:  VITAL SIGNS: BP 111/71  Pulse Rate 72  Temp 96.9 F (36.1 C)Temp. 96.9 F (36.1 C). Data is abnormal. Taken on 07/27/23 8:07 AM  Weight 174 lb (78.9 kg)  Height 5\' 10"  (1.778 m)  Resp 16  SpO2 99 %   Constitutional: Alert and oriented. Well appearing and in no acute distress. Eyes: Conjunctivae are normal. PERRL. EOMI. Head: Atraumatic. Nose: No congestion/rhinnorhea. Mouth/Throat: Mucous membranes are moist.  Oropharynx non-erythematous. Neck: No stridor.  No cervical spine tenderness to palpation. Hematological/Lymphatic/Immunilogical: No cervical lymphadenopathy. Cardiovascular: Normal rate, regular rhythm. Grossly normal heart sounds.  Good peripheral circulation. Respiratory: Normal respiratory effort.  No retractions. Lungs CTAB. Gastrointestinal: Soft and nontender. No distention. No abdominal bruits. No CVA tenderness. Genitourinary: Deferred Musculoskeletal: No lower extremity tenderness nor edema.  No joint effusions. Neurologic:  Normal speech and language. No gross focal neurologic deficits are appreciated. No gait instability. Skin:  Skin is warm, dry and intact. No rash noted. Psychiatric: Mood and affect are normal. Speech and behavior are normal.  ____________________________________________   LABS         Component Ref Range & Units (  hover) 7 d ago (07/20/23) 1 yr ago (07/22/22) 2 yr ago (07/16/21) 2 yr ago (08/13/20) 3 yr ago (09/19/19)  Color, UA Amber yellow yellow Yellow yellow  Clarity, UA Clear clear clear Clear clear  Glucose, UA Negative Negative Negative Negative Negative   Bilirubin, UA Negative neg negative Negative negative  Ketones, UA Negative neg negative Negative negative  Spec Grav, UA 1.020 1.025 >=1.030 Abnormal  >=1.030 Abnormal  >=1.030 Abnormal   Blood, UA Negative neg negative Negative negative  pH, UA 6.0 7.5 6.0 6.0 5.0  Protein, UA Negative Negative Negative Negative Negative  Urobilinogen, UA 0.2 0.2 negative Abnormal  0.2 0.2  Nitrite, UA Negative neg negative Negative negative  Leukocytes, UA Negative Negative Negative Negative Negative  Appearance   dark    Odor             Specimen Collected: 07/20/23 08:59 Last Resulted: 07/20/23 08:59      Lab Flowsheet      Order Details      View Encounter      Lab and Collection Details      Routing      Result History    View All Conversations on this Encounter    Result Care Coordination   Patient Communication   Add Comments   Seen Back to Top   Other Results from 07/20/2023   Contains abnormal data CMP12+LP+TP+TSH+6AC+PSA+CBC. Order: [id number]  Status: Final result     Next appt: None     Dx: Routine adult health maintenance     Test Result Released: Yes (seen)   0 Result Notes         Component Ref Range & Units (hover) 7 d ago (07/20/23) 9 mo ago (10/25/22) 1 yr ago (07/22/22) 2 yr ago (07/16/21) 2 yr ago (08/13/20) 3 yr ago (09/19/19)  Glucose 102 High   103 High  93 102 High  R 107 High  R  Uric Acid 6.3  6.5 CM 6.0 CM 5.9 CM 5.7 CM  Comment:            Therapeutic target for gout patients: <6.0  BUN 14  27 High  21 15 17   Creatinine, Ser 1.29 High   1.28 High  1.20 1.24 1.20  eGFR 65  66 72    BUN/Creatinine Ratio 11  21 High  18 12 14   Sodium 140  135 140 139 140  Potassium 3.4 Low   3.5 4.5 5.0 5.0  Chloride 95 Low   92 Low  103 103 104  Calcium 9.6  10.2 9.5 9.6 9.5  Phosphorus 2.9  3.2 3.5 3.1 3.8  Total Protein 6.9  7.3 6.7 6.5 6.4  Albumin 4.4  4.7 4.5 4.4 4.4  Globulin, Total 2.5  2.6 2.2 2.1 2.0  Bilirubin Total 0.6  0.5 0.5 0.3 0.2  Alkaline  Phosphatase 90  96 80 64 69 R  LDH 157  125 140 124 117 Low   AST 30  19 14 19 18   ALT 29  21 14 19 19   GGT 13  18 12 10 12   Iron 136  141 115 74 84  Cholesterol, Total 121 149 240 High  213 High  208 High  211 High   Triglycerides 84 108 151 High  88 85 111  HDL 46 49 47 42 45 47  VLDL Cholesterol Cal 16 20 28 16 15 20   LDL Chol Calc (NIH) 59 80 638 High  155 High  148 High  144 High   Chol/HDL Ratio 2.6 3.0 CM 5.1 High  CM 5.1 High  CM 4.6 CM 4.5 CM  Comment:                                   T. Chol/HDL Ratio                                             Men  Women                               1/2 Avg.Risk  3.4    3.3                                   Avg.Risk  5.0    4.4                                2X Avg.Risk  9.6    7.1                                3X Avg.Risk 23.4   11.0  Estimated CHD Risk  < 0.5  1.1 High  CM 1.0 CM 0.9 CM 0.9 CM  Comment: The CHD Risk is based on the T. Chol/HDL ratio. Other factors affect CHD Risk such as hypertension, smoking, diabetes, severe obesity, and family history of premature CHD.  TSH 0.938  1.550 0.918 1.160 1.370  T4, Total 7.7  7.5 5.1 5.9 5.0  T3 Uptake Ratio 31  30 28 27 28   Free Thyroxine Index 2.4  2.3 1.4 1.6 1.4  Prostate Specific Ag, Serum 0.9  0.6 CM 0.5 CM 0.6 CM 0.7 CM  Comment: Roche ECLIA methodology. According to the American Urological Association, Serum PSA should decrease and remain at undetectable levels after radical prostatectomy. The AUA defines biochemical recurrence as an initial PSA value 0.2 ng/mL or greater followed by a subsequent confirmatory PSA value 0.2 ng/mL or greater. Values obtained with different assay methods or kits cannot be used interchangeably. Results cannot be interpreted as absolute evidence of the presence or absence of malignant disease.  WBC 5.5  6.0 5.1 4.9 4.8  RBC 5.00  5.42 5.28 4.98 4.94  Hemoglobin 15.9  16.6 16.1 15.4 15.1  Hematocrit 46.3  48.3 48.3 44.7 44.9  MCV 93  89 92 90  91  MCH 31.8  30.6 30.5 30.9 30.6  MCHC 34.3  34.4 33.3 34.5 33.6  RDW 12.0  12.1 12.3 12.4 12.7  Platelets 256  275 254 259 248  Neutrophils 52  54 56 53 54  Lymphs 27  32 28 31 27   Monocytes 11  9 11 11 11   Eos 5  4 4 4 7   Basos 1  1 1 1 1   Neutrophils Absolute 2.9  3.3 2.9 2.6 2.6  Lymphocytes Absolute 1.5  1.9 1.4 1.5 1.3  Monocytes Absolute 0.6  0.5 0.5 0.5 0.5  EOS (ABSOLUTE) 0.3  0.2 0.2 0.2 0.3  Basophils Absolute 0.0  0.0 0.0 0.0 0.0  Immature Granulocytes 4  0 0 0 0  Immature Grans           ____________________________________________  EKG  Nonspecific QRS widening at 62 bpm ____________________________________________    ____________________________________________   INITIAL IMPRESSION / ASSESSMENT AND PLAN / ED COURSE  As part of my medical decision making, I reviewed the following data within the electronic MEDICAL RECORD NUMBER       No acute findings on physical exam, EKG, or labs.     ____________________________________________   FINAL CLINICAL IMPRESSION Well exam   ED Discharge Orders     None        Note:  This document was prepared using Dragon voice recognition software and may include unintentional dictation errors.

## 2023-08-04 ENCOUNTER — Other Ambulatory Visit: Payer: Self-pay

## 2023-08-04 DIAGNOSIS — I10 Essential (primary) hypertension: Secondary | ICD-10-CM

## 2023-08-04 MED ORDER — METOLAZONE 5 MG PO TABS: 5.0000 mg | ORAL_TABLET | Freq: Every day | ORAL | 3 refills | Status: DC

## 2023-10-04 ENCOUNTER — Other Ambulatory Visit: Payer: Self-pay

## 2023-10-04 DIAGNOSIS — I10 Essential (primary) hypertension: Secondary | ICD-10-CM

## 2023-10-04 MED ORDER — METOLAZONE 5 MG PO TABS: 5.0000 mg | ORAL_TABLET | Freq: Every day | ORAL | 3 refills | Status: AC

## 2024-03-04 ENCOUNTER — Encounter: Payer: Self-pay | Admitting: Physician Assistant

## 2024-03-04 ENCOUNTER — Ambulatory Visit: Payer: Self-pay | Admitting: Physician Assistant

## 2024-03-04 VITALS — BP 118/82 | HR 75 | Temp 97.3°F | Resp 16 | Wt 173.0 lb

## 2024-03-04 DIAGNOSIS — R1032 Left lower quadrant pain: Secondary | ICD-10-CM | POA: Insufficient documentation

## 2024-03-04 NOTE — Progress Notes (Signed)
 Reports some low left sided abdominal/groin intermittent discomfort for approximately one month.  No GI related complaints with reports of regular BM and denies any urinary complaints.  No injury specific reported, but exercises in a gym 3x week and plays regular pickleball.  He thinks maybe he strained something and also concerns maybe of hernia, but unable to palpate specific himself any concern and no pain at this moments.

## 2024-03-04 NOTE — Addendum Note (Signed)
 Addended by: CLAUDENE TANDA POUR on: 03/04/2024 02:09 PM   Modules accepted: Orders

## 2024-03-04 NOTE — Addendum Note (Signed)
 Addended by: CLAUDENE TANDA POUR on: 03/04/2024 09:48 AM   Modules accepted: Orders

## 2024-03-04 NOTE — Progress Notes (Signed)
   Subjective: Left inguinal pain    Patient ID: Brett Hunter, male    DOB: Jun 08, 1966, 58 y.o.   MRN: [id number]  HPI Patient complaining of left inguinal pain x 2 to 3 weeks.  Patient admits to increased physical activities consist of pickleball abdominal core strengthening.  Patient states pain waxes and wanes.  Pain increased with physical activities.  Patient rates the pain as a 6/10.  Patient stopped all physical exercise in the past week.  Voices concern for hernia.   Review of Systems Hypertension    Objective:   Physical Exam BP 118/82  Pulse Rate 75  Temp 97.3 F (36.3 C)  Weight 173 lb (78.5 kg)  Resp 16  SpO2 98 %  Physical exam reveals negative HSM, normoactive bowel sounds, no palpable mass in the left lower quadrant.  Mild guarding with palpation.       Assessment & Plan: Left inguinal pain   Advised the patient low suspicion for hernia at this time however will schedule abdominal ultrasound for definitive evaluation.

## 2024-03-05 ENCOUNTER — Ambulatory Visit
Admission: RE | Admit: 2024-03-05 | Discharge: 2024-03-05 | Disposition: A | Discharge status: Home / Self Care | Source: Ambulatory Visit | Attending: Physician Assistant | Admitting: Physician Assistant

## 2024-03-05 DIAGNOSIS — R1032 Left lower quadrant pain: Secondary | ICD-10-CM | POA: Diagnosis present

## 2024-05-24 ENCOUNTER — Ambulatory Visit: Payer: Self-pay

## 2024-07-17 ENCOUNTER — Ambulatory Visit: Payer: Self-pay

## 2024-07-17 ENCOUNTER — Encounter

## 2024-07-17 DIAGNOSIS — Z0000 Encounter for general adult medical examination without abnormal findings: Secondary | ICD-10-CM

## 2024-07-17 LAB — POCT URINALYSIS DIPSTICK
Bilirubin, UA: NEGATIVE
Blood, UA: NEGATIVE
Glucose, UA: NEGATIVE
Ketones, UA: NEGATIVE
Leukocytes, UA: NEGATIVE
Nitrite, UA: NEGATIVE
Protein, UA: NEGATIVE
Spec Grav, UA: 1.015
Urobilinogen, UA: 0.2 U/dL
pH, UA: 6

## 2024-07-19 LAB — CMP12+LP+TP+TSH+6AC+PSA+CBC…
ALT: 23 IU/L (ref 0–44)
AST: 20 IU/L (ref 0–40)
Albumin: 4.5 g/dL (ref 3.8–4.9)
Alkaline Phosphatase: 74 IU/L (ref 47–123)
BUN/Creatinine Ratio: 23 — ABNORMAL HIGH (ref 9–20)
BUN: 25 mg/dL — ABNORMAL HIGH (ref 6–24)
Basophils Absolute: 0 x10E3/uL (ref 0.0–0.2)
Basos: 1 %
Bilirubin Total: 0.6 mg/dL (ref 0.0–1.2)
Calcium: 10.3 mg/dL — ABNORMAL HIGH (ref 8.7–10.2)
Chloride: 95 mmol/L — ABNORMAL LOW (ref 96–106)
Chol/HDL Ratio: 3.1 ratio (ref 0.0–5.0)
Cholesterol, Total: 159 mg/dL (ref 100–199)
Creatinine, Ser: 1.1 mg/dL (ref 0.76–1.27)
EOS (ABSOLUTE): 0.3 x10E3/uL (ref 0.0–0.4)
Eos: 4 %
Free Thyroxine Index: 2.1 (ref 1.2–4.9)
GGT: 14 IU/L (ref 0–65)
Globulin, Total: 2.7 g/dL (ref 1.5–4.5)
Glucose: 101 mg/dL — ABNORMAL HIGH (ref 70–99)
HDL: 52 mg/dL
Hematocrit: 49.6 % (ref 37.5–51.0)
Hemoglobin: 16.7 g/dL (ref 13.0–17.7)
Immature Grans (Abs): 0 x10E3/uL (ref 0.0–0.1)
Immature Granulocytes: 0 %
Iron: 132 ug/dL (ref 38–169)
LDH: 131 IU/L (ref 121–224)
LDL Chol Calc (NIH): 78 mg/dL (ref 0–99)
Lymphocytes Absolute: 1.7 x10E3/uL (ref 0.7–3.1)
Lymphs: 27 %
MCH: 31.5 pg (ref 26.6–33.0)
MCHC: 33.7 g/dL (ref 31.5–35.7)
MCV: 93 fL (ref 79–97)
Monocytes Absolute: 0.6 x10E3/uL (ref 0.1–0.9)
Monocytes: 10 %
Neutrophils Absolute: 3.6 x10E3/uL (ref 1.4–7.0)
Neutrophils: 58 %
Phosphorus: 3.6 mg/dL (ref 2.8–4.1)
Platelets: 247 x10E3/uL (ref 150–450)
Potassium: 3.6 mmol/L (ref 3.5–5.2)
Prostate Specific Ag, Serum: 0.6 ng/mL (ref 0.0–4.0)
RBC: 5.31 x10E6/uL (ref 4.14–5.80)
RDW: 12.8 % (ref 11.6–15.4)
Sodium: 138 mmol/L (ref 134–144)
T3 Uptake Ratio: 28 % (ref 24–39)
T4, Total: 7.4 ug/dL (ref 4.5–12.0)
TSH: 1.14 u[IU]/mL (ref 0.450–4.500)
Total Protein: 7.2 g/dL (ref 6.0–8.5)
Triglycerides: 170 mg/dL — ABNORMAL HIGH (ref 0–149)
Uric Acid: 7.2 mg/dL (ref 3.8–8.4)
VLDL Cholesterol Cal: 29 mg/dL (ref 5–40)
WBC: 6.3 x10E3/uL (ref 3.4–10.8)
eGFR: 78 mL/min/1.73

## 2024-07-24 ENCOUNTER — Telehealth: Payer: Self-pay

## 2024-07-24 ENCOUNTER — Ambulatory Visit: Payer: Self-pay | Admitting: Physician Assistant

## 2024-07-24 ENCOUNTER — Encounter: Payer: Self-pay | Admitting: Physician Assistant

## 2024-07-24 VITALS — BP 114/73 | HR 68 | Temp 97.6°F | Resp 14 | Ht 70.0 in | Wt 180.0 lb

## 2024-07-24 DIAGNOSIS — R1032 Left lower quadrant pain: Secondary | ICD-10-CM

## 2024-07-24 DIAGNOSIS — Z0000 Encounter for general adult medical examination without abnormal findings: Secondary | ICD-10-CM

## 2024-07-24 DIAGNOSIS — E782 Mixed hyperlipidemia: Secondary | ICD-10-CM

## 2024-07-24 NOTE — Progress Notes (Signed)
 "  [facility] ____________________________________________   None    (approximate)  I have reviewed the triage vital signs and the nursing notes.   HISTORY  Chief Complaint No chief complaint on file.   HPI Brett Hunter is a 59 y.o. male patient presents for annual physical exam for the [facility] [place].  Complaining of persistent left inguinal pain for 3 to 4 months.  Unrelieved with decreased physical activity.  Negative ultrasound.         Past Medical History:  Diagnosis Date   Elevated lipids    Hypertension    Overweight    Skin lesion    Tinea unguium    Toenail fungus     Patient Active Problem List   Diagnosis Date Noted   Hypertension 09/26/2019    Past Surgical History:  Procedure Laterality Date   COLONOSCOPY  08/2017   WISDOM TOOTH EXTRACTION      Prior to Admission medications  Medication Sig Start Date End Date Taking? Authorizing Provider  Cyanocobalamin (VITAMIN B-12 PO) Take 1 tablet by mouth daily.    [provider]  hydrocortisone 2.5 % cream Apply topically. Patient not taking: Reported on 07/27/2023 05/12/21   [provider]  metolazone  (ZAROXOLYN ) 5 MG tablet Take 1 tablet (5 mg total) by mouth daily. 10/04/23   Claudene Tanda POUR, PA-C  Multiple Vitamin (MULTIVITAMIN ADULT PO) Take 1 tablet by mouth daily.    [provider]  rosuvastatin  (CRESTOR ) 40 MG tablet TAKE 1 TABLET BY MOUTH EVERY DAY 07/14/23   Claudene Tanda POUR, PA-C    Allergies Patient has no known allergies.  Family History  Problem Relation Age of Onset   Diabetes Father     Social History Social History[1]  Review of Systems Constitutional: No fever/chills Eyes: No visual changes. ENT: No sore throat. Cardiovascular: Denies chest pain. Respiratory: Denies shortness of breath. Gastrointestinal: No abdominal pain.  No nausea, no vomiting.  No diarrhea.  No constipation. Genitourinary: Negative for  dysuria. Musculoskeletal: Negative for back pain.  Left inguinal pain Skin: Negative for rash. Neurological: Negative for headaches, focal weakness or numbness. Endocrine: Hyperlipidemia and hypertension ____________________________________________   PHYSICAL EXAM:  VITAL SIGNS: BP 114/73  Cuff Size Normal  Pulse Rate 68  Temp 97.6 F (36.4 C)  Temp Source Temporal  Weight 180 lb (81.6 kg)  Height 5' 10 (1.778 m)  Resp 14  SpO2 98 %  A table that contains information about other calculated vitals such as BMI and BSA. Other Vitals  BMI: 25.83 kg/m2  BSA: 2.01 m2   Constitutional: Alert and oriented. Well appearing and in no acute distress. Eyes: Conjunctivae are normal. PERRL. EOMI. Head: Atraumatic. Nose: No congestion/rhinnorhea. Mouth/Throat: Mucous membranes are moist.  Oropharynx non-erythematous. Neck: No stridor.  No cervical spine tenderness to palpation. Hematological/Lymphatic/Immunilogical: No cervical lymphadenopathy. Cardiovascular: Normal rate, regular rhythm. Grossly normal heart sounds.  Good peripheral circulation. Respiratory: Normal respiratory effort.  No retractions. Lungs CTAB. Gastrointestinal: Soft and nontender. No distention. No abdominal bruits. No CVA tenderness. Genitourinary: Guarding with palpation of the left inguinal area Musculoskeletal: No lower extremity tenderness nor edema.  No joint effusions. Neurologic:  Normal speech and language. No gross focal neurologic deficits are appreciated. No gait instability. Skin:  Skin is warm, dry and intact. No rash noted. Psychiatric: Mood and affect are normal. Speech and behavior are normal.  ____________________________________________   LABS          Component Ref Range &  Units (hover) 7 d ago (07/17/24) 1 yr ago (07/20/23) 2 yr ago (07/22/22) 3 yr ago (07/16/21) 3 yr ago (08/13/20) 4 yr ago (09/19/19)  Color, UA Yellow Amber yellow yellow Yellow yellow  Clarity, UA Clear Clear clear clear  Clear clear  Glucose, UA Negative Negative Negative Negative Negative Negative  Bilirubin, UA Negative Negative neg negative Negative negative  Ketones, UA Negative Negative neg negative Negative negative  Spec Grav, UA 1.015 1.020 1.025 >=1.030 Abnormal  >=1.030 Abnormal  >=1.030 Abnormal   Blood, UA Negative Negative neg negative Negative negative  pH, UA 6.0 6.0 7.5 6.0 6.0 5.0  Protein, UA Negative Negative Negative Negative Negative Negative  Urobilinogen, UA 0.2 0.2 0.2 negative Abnormal  0.2 0.2  Nitrite, UA Negative Negative neg negative Negative negative  Leukocytes, UA Negative Negative Negative Negative Negative Negative  Appearance    dark    Odor                     Component Ref Range & Units (hover) 7 d ago (07/17/24) 1 yr ago (07/20/23) 1 yr ago (10/25/22) 2 yr ago (07/22/22) 3 yr ago (07/16/21) 3 yr ago (08/13/20) 4 yr ago (09/19/19)  Glucose 101 High  102 High   103 High  93 102 High  R 107 High  R  Uric Acid 7.2 6.3 CM  6.5 CM 6.0 CM 5.9 CM 5.7 CM  Comment:            Therapeutic target for gout patients: <6.0  BUN 25 High  14  27 High  21 15 17   Creatinine, Ser 1.10 1.29 High   1.28 High  1.20 1.24 1.20  eGFR 78 65  66 72    BUN/Creatinine Ratio 23 High  11  21 High  18 12 14   Sodium 138 140  135 140 139 140  Potassium 3.6 3.4 Low   3.5 4.5 5.0 5.0  Chloride 95 Low  95 Low   92 Low  103 103 104  Calcium  10.3 High  9.6  10.2 9.5 9.6 9.5  Phosphorus 3.6 2.9  3.2 3.5 3.1 3.8  Total Protein 7.2 6.9  7.3 6.7 6.5 6.4  Albumin 4.5 4.4  4.7 4.5 4.4 4.4  Globulin, Total 2.7 2.5  2.6 2.2 2.1 2.0  Bilirubin Total 0.6 0.6  0.5 0.5 0.3 0.2  Alkaline Phosphatase 74 90 R  96 R 80 R 64 R 69 R  LDH 131 157  125 140 124 117 Low   AST 20 30  19 14 19 18   ALT 23 29  21 14 19 19   GGT 14 13  18 12 10 12   Iron 132 136  141 115 74 84  Cholesterol, Total 159 121 149 240 High  213 High  208 High  211 High   Triglycerides 170 High  84 108 151 High  88 85 111  HDL 52 46 49 47 42 45 47   VLDL Cholesterol Cal 29 16 20 28 16 15 20   LDL Chol Calc (NIH) 78 59 80 165 High  155 High  148 High  144 High   Chol/HDL Ratio 3.1 2.6 CM 3.0 CM 5.1 High  CM 5.1 High  CM 4.6 CM 4.5 CM  Comment:                                   T. Chol/HDL Ratio  Men  Women                               1/2 Avg.Risk  3.4    3.3                                   Avg.Risk  5.0    4.4                                2X Avg.Risk  9.6    7.1                                3X Avg.Risk 23.4   11.0  Estimated CHD Risk  < 0.5  < 0.5 CM  1.1 High  CM 1.0 CM 0.9 CM 0.9 CM  Comment: The CHD Risk is based on the T. Chol/HDL ratio. Other factors affect CHD Risk such as hypertension, smoking, diabetes, severe obesity, and family history of premature CHD.  TSH 1.140 0.938  1.550 0.918 1.160 1.370  T4, Total 7.4 7.7  7.5 5.1 5.9 5.0  T3 Uptake Ratio 28 31  30 28 27 28   Free Thyroxine Index 2.1 2.4  2.3 1.4 1.6 1.4  Prostate Specific Ag, Serum 0.6 0.9 CM  0.6 CM 0.5 CM 0.6 CM 0.7 CM  Comment: Roche ECLIA methodology. According to the American U[employer] Association, Serum PSA should decrease and remain at undetectable levels after radical prostatectomy. The AUA defines biochemical recurrence as an initial PSA value 0.2 ng/mL or greater followed by a subsequent confirmatory PSA value 0.2 ng/mL or greater. Values obtained with different assay methods or kits cannot be used interchangeably. Results cannot be interpreted as absolute evidence of the presence or absence of malignant disease.  WBC 6.3 5.5  6.0 5.1 4.9 4.8  RBC 5.31 5.00  5.42 5.28 4.98 4.94  Hemoglobin 16.7 15.9  16.6 16.1 15.4 15.1  Hematocrit 49.6 46.3  48.3 48.3 44.7 44.9  MCV 93 93  89 92 90 91  MCH 31.5 31.8  30.6 30.5 30.9 30.6  MCHC 33.7 34.3  34.4 33.3 34.5 33.6  RDW 12.8 12.0  12.1 12.3 12.4 12.7  Platelets 247 256  275 254 259 248  Neutrophils 58 52  54 56 53 54  Lymphs 27 27  32 28 31 27    Monocytes 10 11  9 11 11 11   Eos 4 5  4 4 4 7   Basos 1 1  1 1 1 1   Neutrophils Absolute 3.6 2.9  3.3 2.9 2.6 2.6  Lymphocytes Absolute 1.7 1.5  1.9 1.4 1.5 1.3  Monocytes Absolute 0.6 0.6  0.5 0.5 0.5 0.5  EOS (ABSOLUTE) 0.3 0.3  0.2 0.2 0.2 0.3  Basophils Absolute 0.0 0.0  0.0 0.0 0.0 0.0  Immature Granulocytes 0 4  0 0 0 0  Immature Grans (Abs) 0.0 0.2 High  CM  0.0 0.0 0.0 0.0                   ____________________________________________  EKG  Sinus rhythm at 64 bpm ____________________________________________    ____________________________________________   INITIAL IMPRESSION / ASSESSMENT AND PLAN  As part of my medical decision making, I reviewed the following data within the electronic  MEDICAL RECORD NUMBER      No acute findings on physical exam except for left inguinal guarding with palpation.  Labs reveal continued elevated triglycerides however cholesterol and HDL levels are within normal limits.  Will consult to surgical clinic for definitive evaluation of left inguinal pain.        ____________________________________________   FINAL CLINICAL IMPRESSION    ED Discharge Orders     None        Note:  This document was prepared using Dragon voice recognition software and may include unintentional dictation errors.     [1]  Social History Tobacco Use   Smoking status: Never   Smokeless tobacco: Current    Types: Snuff   "

## 2024-07-24 NOTE — Telephone Encounter (Signed)
 Rec'd message from PA Tanda Sharps to make a surgical referral for Brett Hunter. He left the clinic before I could discuss with Brett Hunter who he wants to see. Tried to call Brett Hunter.  Went to [facility] [facility].  Left a message for Brett Hunter to call me back.

## 2024-07-24 NOTE — Addendum Note (Signed)
 Addended by: ELUTERIO JENKINS HERO on: 07/24/2024 10:45 AM   Modules accepted: Orders

## 2024-07-24 NOTE — Progress Notes (Signed)
 Surgical referral faxed to Dr. Lucas Sjogren at [facility]. Fax Number:  [phone]

## 2024-07-24 NOTE — Progress Notes (Signed)
 Pt present today to complete physical, Pt still having left  dull groin pain.

## 2024-08-05 ENCOUNTER — Other Ambulatory Visit: Payer: Self-pay | Admitting: General Surgery

## 2024-08-05 DIAGNOSIS — R1032 Left lower quadrant pain: Secondary | ICD-10-CM

## 2024-08-14 ENCOUNTER — Ambulatory Visit
Admission: RE | Admit: 2024-08-14 | Discharge: 2024-08-14 | Disposition: A | Source: Ambulatory Visit | Attending: General Surgery

## 2024-08-14 DIAGNOSIS — R1032 Left lower quadrant pain: Secondary | ICD-10-CM
